# Patient Record
Sex: Male | Born: 1965 | Race: White | Hispanic: No | Marital: Married | Smoking: Never smoker
Health system: Southern US, Community
[De-identification: ages and names within clinical notes are randomized; demographics above are authoritative.]

## PROBLEM LIST (undated history)

## (undated) DIAGNOSIS — K7689 Other specified diseases of liver: Secondary | ICD-10-CM

## (undated) DIAGNOSIS — M79671 Pain in right foot: Secondary | ICD-10-CM

## (undated) DIAGNOSIS — G8929 Other chronic pain: Secondary | ICD-10-CM

## (undated) DIAGNOSIS — Q6102 Congenital multiple renal cysts: Secondary | ICD-10-CM

## (undated) DIAGNOSIS — Z125 Encounter for screening for malignant neoplasm of prostate: Secondary | ICD-10-CM

## (undated) DIAGNOSIS — N529 Male erectile dysfunction, unspecified: Secondary | ICD-10-CM

## (undated) DIAGNOSIS — M159 Polyosteoarthritis, unspecified: Secondary | ICD-10-CM

## (undated) DIAGNOSIS — K219 Gastro-esophageal reflux disease without esophagitis: Secondary | ICD-10-CM

## (undated) DIAGNOSIS — R0789 Other chest pain: Principal | ICD-10-CM

## (undated) DIAGNOSIS — N182 Chronic kidney disease, stage 2 (mild): Secondary | ICD-10-CM

## (undated) DIAGNOSIS — M545 Low back pain, unspecified: Secondary | ICD-10-CM

## (undated) DIAGNOSIS — G4733 Obstructive sleep apnea (adult) (pediatric): Secondary | ICD-10-CM

## (undated) DIAGNOSIS — E7800 Pure hypercholesterolemia, unspecified: Secondary | ICD-10-CM

## (undated) DIAGNOSIS — E669 Obesity, unspecified: Secondary | ICD-10-CM

## (undated) DIAGNOSIS — K5900 Constipation, unspecified: Secondary | ICD-10-CM

## (undated) DIAGNOSIS — K8020 Calculus of gallbladder without cholecystitis without obstruction: Secondary | ICD-10-CM

## (undated) DIAGNOSIS — M170 Bilateral primary osteoarthritis of knee: Secondary | ICD-10-CM

## (undated) DIAGNOSIS — R011 Cardiac murmur, unspecified: Secondary | ICD-10-CM

## (undated) DIAGNOSIS — E119 Type 2 diabetes mellitus without complications: Principal | ICD-10-CM

## (undated) DIAGNOSIS — E66811 Obesity, class 1: Secondary | ICD-10-CM

## (undated) DIAGNOSIS — M79672 Pain in left foot: Secondary | ICD-10-CM

## (undated) DIAGNOSIS — N200 Calculus of kidney: Secondary | ICD-10-CM

## (undated) HISTORY — DX: Bilateral primary osteoarthritis of knee: M17.0

## (undated) HISTORY — DX: Other chronic pain: G89.29

## (undated) HISTORY — DX: Obesity, unspecified: E66.9

## (undated) HISTORY — DX: Cardiac murmur, unspecified: R01.1

## (undated) HISTORY — DX: Type 2 diabetes mellitus without complications: E11.9

## (undated) HISTORY — DX: Chronic kidney disease, stage 2 (mild): N18.2

## (undated) HISTORY — DX: Polyosteoarthritis, unspecified: M15.9

## (undated) HISTORY — DX: Calculus of gallbladder without cholecystitis without obstruction: K80.20

## (undated) HISTORY — DX: Pure hypercholesterolemia, unspecified: E78.00

## (undated) HISTORY — DX: Constipation, unspecified: K59.00

## (undated) HISTORY — DX: Obesity, class 1: E66.811

## (undated) HISTORY — PX: US RENAL/AORTA: HXRAD530

## (undated) HISTORY — DX: Encounter for screening for malignant neoplasm of prostate: Z12.5

## (undated) HISTORY — DX: Pain in left foot: M79.672

## (undated) HISTORY — DX: Congenital multiple renal cysts: Q61.02

## (undated) HISTORY — DX: Gastro-esophageal reflux disease without esophagitis: K21.9

## (undated) HISTORY — DX: Low back pain, unspecified: M54.50

## (undated) HISTORY — DX: Obstructive sleep apnea (adult) (pediatric): G47.33

## (undated) HISTORY — DX: Other chest pain: R07.89

## (undated) HISTORY — DX: Male erectile dysfunction, unspecified: N52.9

## (undated) HISTORY — DX: Pain in right foot: M79.671

## (undated) HISTORY — DX: Other specified diseases of liver: K76.89

---

## 1989-11-16 HISTORY — PX: ANTERIOR CRUCIATE LIGAMENT REPAIR: SHX115

## 2003-01-25 ENCOUNTER — Encounter: Payer: Self-pay | Admitting: Emergency Medicine

## 2003-01-25 ENCOUNTER — Emergency Department (HOSPITAL_COMMUNITY): Admission: EM | Admit: 2003-01-25 | Discharge: 2003-01-25 | Payer: Self-pay | Admitting: Emergency Medicine

## 2009-03-31 ENCOUNTER — Ambulatory Visit: Payer: Self-pay | Admitting: Family Medicine

## 2009-03-31 DIAGNOSIS — S335XXA Sprain of ligaments of lumbar spine, initial encounter: Secondary | ICD-10-CM

## 2009-03-31 DIAGNOSIS — S339XXA Sprain of unspecified parts of lumbar spine and pelvis, initial encounter: Secondary | ICD-10-CM | POA: Insufficient documentation

## 2010-10-31 ENCOUNTER — Ambulatory Visit: Payer: Self-pay | Admitting: Emergency Medicine

## 2010-10-31 LAB — CONVERTED CEMR LAB: Rapid Strep: NEGATIVE

## 2010-12-18 NOTE — Assessment & Plan Note (Signed)
Summary: COLD/PAIN IN BACK FROM KIDNEY STONE?   Vital Signs:  Patient Profile:   45 Years Old Male CC:      Cold & URI symptoms Height:     78 inches Weight:      279 pounds O2 Sat:      98 % O2 treatment:    Room Air Temp:     98.5 degrees F oral Pulse rate:   95 / minute Resp:     14 per minute BP sitting:   123 / 79  (left arm) Cuff size:   large  Vitals Entered By: Lajean Saver RN (October 31, 2010 8:45 AM)                  Updated Prior Medication List: * ALEVE as needed POTASSIUM CHLORIDE 10 MEQ/100ML SOLN (POTASSIUM CHLORIDE) 2 pc tid  Current Allergies: No known allergies History of Present Illness History from: patient Chief Complaint: Cold & URI symptoms History of Present Illness: Patient complains of onset of cold symptoms for 2 days.  They have been using Aleve which is helping a little bit. + sore throat + prod cough No pleuritic pain No wheezing + nasal congestion + post-nasal drainage No sinus pain/pressure No chest congestion No itchy/red eyes No earache No hemoptysis No SOB + chills/sweats No fever No nausea No vomiting No abdominal pain No diarrhea No skin rashes + fatigue + myalgias + headache   REVIEW OF SYSTEMS Constitutional Symptoms       Complains of night sweats.     Denies fever, chills, weight loss, weight gain, and fatigue.  Eyes       Denies change in vision, eye pain, eye discharge, glasses, contact lenses, and eye surgery. Ear/Nose/Throat/Mouth       Complains of frequent runny nose, sinus problems, sore throat, and hoarseness.      Denies hearing loss/aids, change in hearing, ear pain, ear discharge, dizziness, frequent nose bleeds, and tooth pain or bleeding.  Respiratory       Complains of productive cough.      Denies dry cough, wheezing, shortness of breath, asthma, bronchitis, and emphysema/COPD.  Cardiovascular       Denies murmurs, chest pain, and tires easily with exhertion.    Gastrointestinal  Denies stomach pain, nausea/vomiting, diarrhea, constipation, blood in bowel movements, and indigestion. Genitourniary       Denies painful urination, kidney stones, and loss of urinary control. Neurological       Complains of headaches.      Denies paralysis, seizures, and fainting/blackouts. Musculoskeletal       Denies muscle pain, joint pain, joint stiffness, decreased range of motion, redness, swelling, muscle weakness, and gout.  Skin       Denies bruising, unusual mles/lumps or sores, and hair/skin or nail changes.  Psych       Denies mood changes, temper/anger issues, anxiety/stress, speech problems, depression, and sleep problems. Other Comments: patient c/o symptoms x 2 days   Past History:  Past Medical History: Unremarkable frequent kidney stones  Past Surgical History: Reviewed history from 03/31/2009 and no changes required. knee surgery  Family History: Reviewed history from 03/31/2009 and no changes required. mother, father and 3 brothers alive and healthy  Social History: Reviewed history from 03/31/2009 and no changes required. denies drinking denies smoking deines recreational drug use Physical Exam General appearance: well developed, well nourished, no acute distress Ears: normal, no lesions or deformities Nasal: clear discharge Oral/Pharynx: tongue normal, posterior pharynx without  erythema or exudate Neck: neck supple,  trachea midline, no masses Chest/Lungs: no rales, wheezes, or rhonchi bilateral, breath sounds equal without effort Heart: regular rate and  rhythm, no murmur Skin: no obvious rashes or lesions MSE: oriented to time, place, and person Assessment New Problems: UPPER RESPIRATORY INFECTION, ACUTE (ICD-465.9)  most likely viral URI rapid strep neg  Plan New Medications/Changes: AMOXICILLIN 875 MG TABS (AMOXICILLIN) 1 by mouth two times a day for 7 days  #14 x 0, 10/31/2010, Hoyt Koch MD PREDNISONE (PAK) 10 MG TABS  (PREDNISONE) use as directed  #QS x 0, 10/31/2010, Hoyt Koch MD CHERATUSSIN AC 100-10 MG/5ML SYRP (GUAIFENESIN-CODEINE) 5cc q6 hrs as needed cough  #6 oz x 0, 10/31/2010, Hoyt Koch MD  New Orders: Est. Patient Level III [04540] Rapid Strep [98119] Planning Comments:   1)  Take the prescribed antibiotic as instructed (hold for a few days) 2)  Use nasal saline solution (over the counter) at least 3 times a day. 3)  Use over the counter decongestants like Zyrtec-D every 12 hours as needed to help with congestion. 4)  Can take tylenol every 6 hours or motrin every 8 hours for pain or fever. 5)  Follow up with your primary doctor  if no improvement in 5-7 days, sooner if increasing pain, fever, or new symptoms.     The patient and/or caregiver has been counseled thoroughly with regard to medications prescribed including dosage, schedule, interactions, rationale for use, and possible side effects and they verbalize understanding.  Diagnoses and expected course of recovery discussed and will return if not improved as expected or if the condition worsens. Patient and/or caregiver verbalized understanding.  Prescriptions: AMOXICILLIN 875 MG TABS (AMOXICILLIN) 1 by mouth two times a day for 7 days  #14 x 0   Entered and Authorized by:   Hoyt Koch MD   Signed by:   Hoyt Koch MD on 10/31/2010   Method used:   Print then Give to Patient   RxID:   [id number] PREDNISONE (PAK) 10 MG TABS (PREDNISONE) use as directed  #QS x 0   Entered and Authorized by:   Hoyt Koch MD   Signed by:   Hoyt Koch MD on 10/31/2010   Method used:   Print then Give to Patient   RxID:   [id number] CHERATUSSIN AC 100-10 MG/5ML SYRP (GUAIFENESIN-CODEINE) 5cc q6 hrs as needed cough  #6 oz x 0   Entered and Authorized by:   Hoyt Koch MD   Signed by:   Hoyt Koch MD on 10/31/2010   Method used:   Print then Give to Patient   RxID:    [id number]   Orders Added: 1)  Est. Patient Level III [42595] 2)  Rapid Strep [63875]    Laboratory Results  Date/Time Received: October 31, 2010 9:03 AM  Date/Time Reported: October 31, 2010 9:03 AM   Other Tests  Rapid Strep: negative  Kit Test Internal QC: Negative   (Normal Range: Negative)

## 2010-12-18 NOTE — Letter (Signed)
Summary: RX POLICY   RX POLICY   Imported By: Dannette Barbara 10/31/2010 09:09:01  _____________________________________________________________________  External Attachment:    Type:   Image     Comment:   External Document

## 2012-08-04 ENCOUNTER — Emergency Department
Admission: EM | Admit: 2012-08-04 | Discharge: 2012-08-04 | Disposition: A | Discharge status: Home / Self Care | Payer: Managed Care, Other (non HMO) | Source: Home / Self Care | Attending: Family Medicine | Admitting: Family Medicine

## 2012-08-04 ENCOUNTER — Encounter: Payer: Self-pay | Admitting: Emergency Medicine

## 2012-08-04 DIAGNOSIS — H60399 Other infective otitis externa, unspecified ear: Secondary | ICD-10-CM

## 2012-08-04 DIAGNOSIS — H6092 Unspecified otitis externa, left ear: Secondary | ICD-10-CM

## 2012-08-04 DIAGNOSIS — H6690 Otitis media, unspecified, unspecified ear: Secondary | ICD-10-CM

## 2012-08-04 DIAGNOSIS — H6692 Otitis media, unspecified, left ear: Secondary | ICD-10-CM

## 2012-08-04 HISTORY — DX: Calculus of kidney: N20.0

## 2012-08-04 MED ORDER — CIPROFLOXACIN-DEXAMETHASONE 0.3-0.1 % OT SUSP: 4.0000 [drp] | Freq: Two times a day (BID) | OTIC | Status: DC

## 2012-08-04 MED ORDER — AMOXICILLIN-POT CLAVULANATE 875-125 MG PO TABS: 1.0000 | ORAL_TABLET | Freq: Two times a day (BID) | ORAL | Status: DC

## 2012-08-04 NOTE — ED Provider Notes (Signed)
History     CSN: [id number]  Arrival date & time 08/04/12  1625   First MD Initiated Contact with Patient 08/04/12 1707      Chief Complaint  Patient presents with  . Otalgia     Patient is a 46 y.o. male presenting with ear pain. The history is provided by the patient.  Otalgia This is a new problem. Episode onset: 5 days ago. There is pain in the left ear. The problem occurs constantly. The problem has been gradually worsening. There has been no fever. The pain is mild. Associated symptoms include ear discharge and hearing loss. Pertinent negatives include no headaches, no rhinorrhea, no sore throat, no vomiting, no neck pain, no cough and no rash.    Past Medical History  Diagnosis Date  . Kidney stones     Past Surgical History  Procedure Date  . Anterior cruciate ligament repair     Family History  Problem Relation Age of Onset  . Osteoarthritis Mother   . Heart failure Father     History  Substance Use Topics  . Smoking status: Never Smoker   . Smokeless tobacco: Not on file  . Alcohol Use: No      Review of Systems  HENT: Positive for hearing loss, ear pain and ear discharge. Negative for sore throat, rhinorrhea and neck pain.   Respiratory: Negative for cough.   Gastrointestinal: Negative for vomiting.  Skin: Negative for rash.  Neurological: Negative for headaches.  All other systems reviewed and are negative.   No sore throat No cough No pleuritic pain No wheezing No nasal congestion No post-nasal drainage No sinus pain/pressure No itchy/red eyes + left earache No hemoptysis No SOB No fever/chills No nausea No vomiting No abdominal pain No diarrhea No urinary symptoms No skin rashes No fatigue No myalgias No headache   Allergies  Review of patient's allergies indicates no known allergies.  Home Medications   Current Outpatient Rx  Name Route Sig Dispense Refill  . POTASSIUM CHLORIDE CRYS ER 15 MEQ PO TBCR Oral Take 15 mEq by  mouth 2 (two) times daily.    . AMOXICILLIN-POT CLAVULANATE 875-125 MG PO TABS Oral Take 1 tablet by mouth 2 (two) times daily. Take with food 20 tablet 0  . CIPROFLOXACIN-DEXAMETHASONE 0.3-0.1 % OT SUSP Left Ear Place 4 drops into the left ear 2 (two) times daily. 7.5 mL 0    BP 131/83  Pulse 84  Temp 98.3 F (36.8 C) (Oral)  Resp 16  Ht 6\' 6"  (1.981 m)  Wt 268 lb (121.564 kg)  BMI 30.97 kg/m2  SpO2 96%  Physical Exam Nursing notes and Vital Signs reviewed. Appearance:  Patient appears healthy, stated age, and in no acute distress Eyes:  Pupils are equal, round, and reactive to light and accomodation.  Extraocular movement is intact.  Conjunctivae are not inflamed  Ears:   Right canal and tympanic membrane normal.  Left canal is edematous with whitish debris and exudate present.  Unable to visualize the left TM because of narrowness of canal.  There is tenderness with insertion of speculum.  Nose:  Mildly congested turbinates.  No sinus tenderness.   Pharynx:  Normal Neck:  Supple.  No adenopathy Lungs:  Clear to auscultation.  Breath sounds are equal.  Heart:  Regular rate and rhythm without murmurs, rubs, or gallops.  Skin:  No rash present.   ED Course  Procedures  None  Labs Reviewed -  Tympanogram low peak height left  ear ("flat line"), normal right ear    1. Left otitis externa   2. Left otitis media       MDM  Begin Ciprodex drops, and Augmentin oral. Suggest using Mucinex D Followup with Family Doctor in 10 days.        Lattie Haw, MD 08/05/12 1348

## 2012-08-04 NOTE — ED Notes (Signed)
Left ear pain x 5 days, rt ear is starting to hurt

## 2012-08-05 ENCOUNTER — Telehealth: Payer: Self-pay | Admitting: Family Medicine

## 2012-08-16 ENCOUNTER — Encounter (HOSPITAL_BASED_OUTPATIENT_CLINIC_OR_DEPARTMENT_OTHER): Payer: Self-pay | Admitting: *Deleted

## 2012-08-16 ENCOUNTER — Emergency Department (HOSPITAL_BASED_OUTPATIENT_CLINIC_OR_DEPARTMENT_OTHER)
Admission: EM | Admit: 2012-08-16 | Discharge: 2012-08-16 | Disposition: A | Discharge status: Home / Self Care | Payer: Managed Care, Other (non HMO) | Attending: Emergency Medicine | Admitting: Emergency Medicine

## 2012-08-16 DIAGNOSIS — T380X5A Adverse effect of glucocorticoids and synthetic analogues, initial encounter: Secondary | ICD-10-CM | POA: Insufficient documentation

## 2012-08-16 DIAGNOSIS — T7840XA Allergy, unspecified, initial encounter: Secondary | ICD-10-CM | POA: Insufficient documentation

## 2012-08-16 DIAGNOSIS — T50905A Adverse effect of unspecified drugs, medicaments and biological substances, initial encounter: Secondary | ICD-10-CM

## 2012-08-16 DIAGNOSIS — L509 Urticaria, unspecified: Secondary | ICD-10-CM

## 2012-08-16 MED ORDER — DIPHENHYDRAMINE HCL 50 MG/ML IJ SOLN
25.0000 mg | Freq: Once | INTRAMUSCULAR | Status: AC
  Administered 2012-08-16: 25 mg via INTRAVENOUS
  Filled 2012-08-16: qty 1

## 2012-08-16 MED ORDER — FAMOTIDINE IN NACL 20-0.9 MG/50ML-% IV SOLN
20.0000 mg | Freq: Once | INTRAVENOUS | Status: AC
  Administered 2012-08-16: 20 mg via INTRAVENOUS
  Filled 2012-08-16: qty 50

## 2012-08-16 MED ORDER — METHYLPREDNISOLONE SODIUM SUCC 125 MG IJ SOLR
125.0000 mg | Freq: Once | INTRAMUSCULAR | Status: AC
  Administered 2012-08-16: 125 mg via INTRAVENOUS
  Filled 2012-08-16: qty 2

## 2012-08-16 MED ORDER — SODIUM CHLORIDE 0.9 % IV SOLN
Freq: Once | INTRAVENOUS | Status: AC
  Administered 2012-08-16: 15:00:00 via INTRAVENOUS

## 2012-08-16 MED ORDER — ALBUTEROL SULFATE (5 MG/ML) 0.5% IN NEBU
5.0000 mg | INHALATION_SOLUTION | Freq: Once | RESPIRATORY_TRACT | Status: AC
  Administered 2012-08-16: 5 mg via RESPIRATORY_TRACT
  Filled 2012-08-16: qty 1

## 2012-08-16 MED ORDER — PREDNISONE 10 MG PO TABS: ORAL_TABLET | ORAL | Status: DC

## 2012-08-16 NOTE — ED Notes (Signed)
Pt placed on 2lpm for comfort.  Pt stating he is having trouble breathing.  No distress noted at this time.  RA Spo2 95%.  ZO10, BBS clear no stridor noted.

## 2012-08-16 NOTE — ED Notes (Addendum)
Allergic reaction. Lips are swelling. Diff breathing and hives. He is taking Prednisone and Benadryl for same on and off since Friday. Amoxicillin and Cipro are listed in his med list. He states he was being treated for ear infection with those medications and finished the last dose on Sat.

## 2012-08-16 NOTE — ED Provider Notes (Signed)
History     CSN: [id number]  Arrival date & time 08/16/12  1428   First MD Initiated Contact with Patient 08/16/12 1445      Chief Complaint  Patient presents with  . Allergic Reaction    (Consider location/radiation/quality/duration/timing/severity/associated sxs/prior treatment) Patient is a 46 y.o. male presenting with allergic reaction. The history is provided by the patient. No language interpreter was used.  Allergic Reaction The primary symptoms are  shortness of breath and urticaria. The current episode started 2 days ago. The problem has been gradually worsening. This is a new problem.  The shortness of breath began today. The shortness of breath is mild.  The urticaria began more than 2 days ago. The urticaria has been gradually improving since its onset. Urticaria is a recurrent problem. Urticaria is located on the abdomen and chest.  The onset of the reaction was associated with a new medication. Significant symptoms also include flushing and itching.  Pt was treated for an ear infection with augmentin and cipro ear drops.   Pt developed a rash and swelling to lips on Saturday.  [place] and is on 3rd and last day of prednisone.   Pt reports increased lip swelling and hives.    Past Medical History  Diagnosis Date  . Kidney stones     Past Surgical History  Procedure Date  . Anterior cruciate ligament repair     Family History  Problem Relation Age of Onset  . Osteoarthritis Mother   . Heart failure Father     History  Substance Use Topics  . Smoking status: Never Smoker   . Smokeless tobacco: Not on file  . Alcohol Use: No      Review of Systems  Respiratory: Positive for shortness of breath.   Skin: Positive for flushing and itching.  All other systems reviewed and are negative.    Allergies  Review of patient's allergies indicates no known allergies.  Home Medications   Current Outpatient Rx  Name Route Sig Dispense Refill    . AMOXICILLIN-POT CLAVULANATE 875-125 MG PO TABS Oral Take 1 tablet by mouth 2 (two) times daily. Take with food 20 tablet 0  . CIPROFLOXACIN-DEXAMETHASONE 0.3-0.1 % OT SUSP Left Ear Place 4 drops into the left ear 2 (two) times daily. 7.5 mL 0  . POTASSIUM CHLORIDE CRYS ER 15 MEQ PO TBCR Oral Take 15 mEq by mouth 2 (two) times daily.      BP 147/87  Pulse 106  Temp 98.6 F (37 C) (Oral)  Resp 20  SpO2 96%  Physical Exam  Nursing note and vitals reviewed. Constitutional: He is oriented to person, place, and time. He appears well-developed and well-nourished.  HENT:  Head: Normocephalic and atraumatic.  Right Ear: External ear normal.  Left Ear: External ear normal.  Nose: Nose normal.  Mouth/Throat: Oropharynx is clear and moist.  Eyes: Conjunctivae normal and EOM are normal. Pupils are equal, round, and reactive to light.  Neck: Normal range of motion. Neck supple.  Cardiovascular: Normal rate and regular rhythm.   Pulmonary/Chest: Effort normal and breath sounds normal.  Abdominal: Soft.  Musculoskeletal: Normal range of motion.  Neurological: He is alert and oriented to person, place, and time. He has normal reflexes.  Skin: Rash noted.  Psychiatric: He has a normal mood and affect.    ED Course  Procedures (including critical care time)  Labs Reviewed - No data to display No results found.   No diagnosis  found.    MDM  Pt given solumedrol, pepcid and benadryl  Iv.   I gave pt albuterol neb.   Pt reports he does not have any trouble breathing.   Pt reports paniced with lip swelling  Pt observed x 3 hours.   I advised pepdid 20 mg a day and benadryl 50 mg every 4 hours.   Prednisone x 6 days.      Lonia Skinner [place], PA 08/16/12 1744  Lonia Skinner [place], [place] 08/16/12 [phone]

## 2012-08-17 NOTE — ED Provider Notes (Signed)
  I performed a history and physical examination of Adrian Cook and discussed his management withKaren Keenan Bachelor, PA-CI agree with the history, physical, assessment, and plan of care, with the following exceptions: None  I was present for the following procedures: None Time Spent in Critical Care of the patient: None Time spent in discussions with the patient and family:7 Cambryn Charters Corlis Leak, MD 08/17/12 1507

## 2012-12-31 ENCOUNTER — Emergency Department (INDEPENDENT_AMBULATORY_CARE_PROVIDER_SITE_OTHER)
Admission: EM | Admit: 2012-12-31 | Discharge: 2012-12-31 | Disposition: A | Discharge status: Home / Self Care | Payer: Managed Care, Other (non HMO) | Source: Home / Self Care | Attending: Family Medicine | Admitting: Family Medicine

## 2012-12-31 DIAGNOSIS — J069 Acute upper respiratory infection, unspecified: Secondary | ICD-10-CM

## 2012-12-31 MED ORDER — AZITHROMYCIN 250 MG PO TABS: ORAL_TABLET | ORAL | Status: DC

## 2012-12-31 MED ORDER — BENZONATATE 200 MG PO CAPS: 200.0000 mg | ORAL_CAPSULE | Freq: Every day | ORAL | Status: DC

## 2012-12-31 NOTE — ED Provider Notes (Signed)
History     CSN: [id number]  Arrival date & time 12/31/12  1103   First MD Initiated Contact with Patient 12/31/12 1242      Chief Complaint  Patient presents with  . Generalized Body Aches    x 4 days  . Eye Pain    x 4 day  . Otalgia    x 4 day  . Cough    x 4 days       HPI Comments: Patient complains of approximately 4 day history of gradually progressive URI symptoms beginning with a mild sore throat (now improved), followed by progressive nasal congestion.  A cough started next.  Complains of fatigue and initial myalgias.  Cough is now worse at night and generally productive during the day.  There has been no pleuritic pain or wheezes, but he notes some tightness in his anterior chest.  He has had mild shortness of breath with activity.   The history is provided by the patient.    Past Medical History  Diagnosis Date  . Kidney stones     Past Surgical History  Procedure Laterality Date  . Anterior cruciate ligament repair      Family History  Problem Relation Age of Onset  . Osteoarthritis Mother   . Heart failure Father     History  Substance Use Topics  . Smoking status: Never Smoker   . Smokeless tobacco: Not on file  . Alcohol Use: No      Review of Systems + sore throat + cough No pleuritic pain but has mild anterior chest discomfort No wheezing + nasal congestion + post-nasal drainage No sinus pain/pressure No itchy/red eyes ? earache No hemoptysis + SOB No fever/chills No nausea No vomiting No abdominal pain No diarrhea No urinary symptoms No skin rashes + fatigue + myalgias + headache Used OTC meds without relief  Allergies  Review of patient's allergies indicates no known allergies.  Home Medications   Current Outpatient Rx  Name  Route  Sig  Dispense  Refill  . potassium chloride SA (KLOR-CON M15) 15 MEQ tablet   Oral   Take 15 mEq by mouth 2 (two) times daily.         Marland Kitchen azithromycin (ZITHROMAX Z-PAK) 250 MG  tablet      Take 2 tabs today; then begin one tab once daily for 4 more days. (Rx void after 01/08/13)   6 each   0   . benzonatate (TESSALON) 200 MG capsule   Oral   Take 1 capsule (200 mg total) by mouth at bedtime. Take as needed for cough   12 capsule   0     BP 128/83  Pulse 75  Temp(Src) 97.8 F (36.6 C) (Oral)  Ht 6\' 5"  (1.956 m)  Wt 275 lb (124.739 kg)  BMI 32.6 kg/m2  SpO2 96%  Physical Exam Nursing notes and Vital Signs reviewed. Appearance:  Patient appears healthy, stated age, and in no acute distress Eyes:  Pupils are equal, round, and reactive to light and accomodation.  Extraocular movement is intact.  Conjunctivae are not inflamed  Ears:  Canals normal.  Tympanic membranes normal.  Nose:  Mildly congested turbinates.  No sinus tenderness.    Pharynx:  Normal Neck:  Supple.  Slightly tender shotty posterior nodes are palpated bilaterally  Lungs:  Clear to auscultation.  Breath sounds are equal.  Heart:  Regular rate and rhythm without murmurs, rubs, or gallops.  Abdomen:  Nontender without masses or  hepatosplenomegaly.  Bowel sounds are present.  No CVA or flank tenderness.  Extremities:  No edema.  No calf tenderness Skin:  No rash present.   ED Course  Procedures  none      1. Acute upper respiratory infections of unspecified site; suspect early viral URI       MDM  There is no evidence of bacterial infection today.   Treat symptomatically for now  Prescription written for Benzonatate (Tessalon) to take at bedtime for night-time cough.  Take Mucinex D (guaifenesin with decongestant) twice daily for congestion.  Increase fluid intake, rest. May use Afrin nasal spray (or generic oxymetazoline) twice daily for about 5 days.  Also recommend using saline nasal spray several times daily and saline nasal irrigation (AYR is a common brand) Stop all antihistamines for now, and other non-prescription cough/cold preparations. Begin Azithromycin if not  improving about 5 days or if persistent fever develops (Given a prescription to hold, with an expiration date)  Recommend a Tdap when well.  Follow-up with family doctor if not improving about 10 days.         Lattie Haw, MD 01/01/13 1336

## 2012-12-31 NOTE — ED Notes (Signed)
Adrian Cook complains of body aches, bilateral eye pain, right ear pain, headaches, sore throat, nasal congestion, sneezing and productive cough with green sputum for 4 days. Denies fever, chills or sweats. His nasal drainage is thick and green. He has tried OTC sinus medication with little relief.

## 2013-12-30 ENCOUNTER — Encounter: Payer: Self-pay | Admitting: Emergency Medicine

## 2013-12-30 ENCOUNTER — Emergency Department (INDEPENDENT_AMBULATORY_CARE_PROVIDER_SITE_OTHER)
Admission: EM | Admit: 2013-12-30 | Discharge: 2013-12-30 | Disposition: A | Discharge status: Home / Self Care | Payer: BC Managed Care – PPO | Source: Home / Self Care | Attending: Family Medicine | Admitting: Family Medicine

## 2013-12-30 DIAGNOSIS — H6120 Impacted cerumen, unspecified ear: Secondary | ICD-10-CM

## 2013-12-30 MED ORDER — CARBAMIDE PEROXIDE 6.5 % OT SOLN: OTIC | Status: DC

## 2013-12-30 NOTE — Discharge Instructions (Signed)
Cerumen Impaction A cerumen impaction is when the wax in your ear forms a plug. This plug usually causes reduced hearing. Sometimes it also causes an earache or dizziness. Removing a cerumen impaction can be difficult and painful. The wax sticks to the ear canal. The canal is sensitive and bleeds easily. If you try to remove a heavy wax buildup with a cotton tipped swab, you may push it in further. Irrigation with water, suction, and small ear curettes may be used to clear out the wax. If the impaction is fixed to the skin in the ear canal, ear drops may be needed for a few days to loosen the wax. People who build up a lot of wax frequently can use ear wax removal products available in your local drugstore. SEEK MEDICAL CARE IF:  You develop an earache, increased hearing loss, or marked dizziness. Document Released: 12/10/2004 Document Revised: 01/25/2012 Document Reviewed: 01/30/2010 [employer] Patient Information 2014 [employer].  

## 2013-12-30 NOTE — ED Notes (Signed)
Reports sense of fullness in left ear x 1 week.

## 2013-12-30 NOTE — ED Provider Notes (Signed)
CSN: [id number]     Arrival date & time 12/30/13  0908 History   First MD Initiated Contact with Patient 12/30/13 [phone]     Chief Complaint  Patient presents with  . Ear Fullness        HPI Comments: Patient complains of left ear being clogged, without pain, for one week.  The history is provided by the patient.    Past Medical History  Diagnosis Date  . Kidney stones    Past Surgical History  Procedure Laterality Date  . Anterior cruciate ligament repair     Family History  Problem Relation Age of Onset  . Osteoarthritis Mother   . Heart failure Father    History  Substance Use Topics  . Smoking status: Never Smoker   . Smokeless tobacco: Not on file  . Alcohol Use: No    Review of Systems No sore throat No cough No pleuritic pain No wheezing No nasal congestion No post-nasal drainage No sinus pain/pressure No itchy/red eyes ? Earache; left ear feels clogged No hemoptysis No SOB No fever/chills No nausea No vomiting No abdominal pain No diarrhea No urinary symptoms No skin rash No fatigue No myalgias No headache     Allergies  Review of patient's allergies indicates no known allergies.  Home Medications   Current Outpatient Rx  Name  Route  Sig  Dispense  Refill  . potassium chloride SA (KLOR-CON M15) 15 MEQ tablet   Oral   Take 15 mEq by mouth 2 (two) times daily.          BP 131/78  Pulse 72  Temp(Src) 98 F (36.7 C) (Oral)  Resp 16  Ht 6\' 6"  (1.981 m)  Wt 265 lb (120.203 kg)  BMI 30.63 kg/m2  SpO2 96% Physical Exam Nursing notes and Vital Signs reviewed. Appearance:  Patient appears healthy, stated age, and in no acute distress Eyes:  Pupils are equal, round, and reactive to light and accomodation.  Extraocular movement is intact.  Conjunctivae are not inflamed  Ears:  Right canal and tympanic membrane normal.  Left canal occluded with ceruem Nose:  Normal turbinates.  No sinus tenderness.  Pharynx:  Normal Neck:  Supple.  No  adenopathy     ED Course  Procedures Left ear lavage:  Post lavage, canal remains partly occluded but tympanic membrane appears normal.  Patient reports that his hearing is restored in left ear.           MDM   Final diagnoses:  Cerumen impaction    Rx written for Debrox.  Return prn    Lattie HawStephen A Sher Hellinger, MD 12/31/13 [phone]

## 2014-11-16 DIAGNOSIS — R0789 Other chest pain: Secondary | ICD-10-CM

## 2014-11-16 HISTORY — DX: Other chest pain: R07.89

## 2015-01-23 ENCOUNTER — Ambulatory Visit (INDEPENDENT_AMBULATORY_CARE_PROVIDER_SITE_OTHER): Payer: BLUE CROSS/BLUE SHIELD | Admitting: Family Medicine

## 2015-01-23 ENCOUNTER — Encounter: Payer: Self-pay | Admitting: Family Medicine

## 2015-01-23 VITALS — BP 120/82 | HR 79 | Temp 97.8°F | Ht 76.5 in | Wt 275.0 lb

## 2015-01-23 DIAGNOSIS — R0789 Other chest pain: Secondary | ICD-10-CM

## 2015-01-23 DIAGNOSIS — G4733 Obstructive sleep apnea (adult) (pediatric): Secondary | ICD-10-CM

## 2015-01-23 NOTE — Progress Notes (Signed)
Office Note 01/23/2015  CC:  Chief Complaint  Patient presents with  . Establish Care   HPI:  Adrian Cook is a 49 y.o. White male who is here to establish care. Patient's most recent primary MD: Dr. Foy Guadalajara but has not seen him in at least 2 yrs.  Specialist: Dr. Margarita Grizzle at [facility] urology. Old records were not reviewed prior to or during today's visit.  Says he gets a soreness/pain around left side of chest only occasionally, was worked up about 10 yrs ago in the ED and was told it was anxiety. He does get this pain when he exerts himself but more often it occurs w/out exertion.  Goes down left arm.  No nausea, gets a light sweat, no palpitations or lightheaded feeling.   This chest pain occurs approx 2 times a month, usually lasts 4-5 hours.  It has been this way over the last 10 yrs.  Hx of GERD but this has improved with dietary changes. Takes rolaids prn.  Has OSA, not using CPAP b/c of "laziness".  His employer is working on getting him a sleep study and apparently all subsequent testing/supplies would be taken care of (so pulm/sleep med referral not required).   Past Medical History  Diagnosis Date  . Kidney stones   . Arthritis   . GERD (gastroesophageal reflux disease)   . Heart murmur     Found at age 16, w/u unrevealing.  No cardiac symptoms.  . Constipation   . OSA (obstructive sleep apnea) approx 2008    was put on CPAP but says he fell out of the habit of using it due to "laziness".  Has CPAP machine but doesn't use it.    Past Surgical History  Procedure Laterality Date  . Anterior cruciate ligament repair  1991    ACL and PCL left knee    Family History  Problem Relation Age of Onset  . Osteoarthritis Mother   . Arthritis Mother   . Heart failure Father   . Leukemia Maternal Grandfather   . Colon cancer Neg Hx   . Prostate cancer Neg Hx     History   Social History  . Marital Status: Married    Spouse Name: N/A  . Number of Children: N/A   . Years of Education: N/A   Occupational History  . Not on file.   Social History Main Topics  . Smoking status: Never Smoker   . Smokeless tobacco: Never Used  . Alcohol Use: No  . Drug Use: No  . Sexual Activity: Not on file   Other Topics Concern  . Not on file   Social History Narrative   Married, no children.   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   No T/A/Ds.    Outpatient Encounter Prescriptions as of 01/23/2015  Medication Sig  . NAPROXEN PO Take 220 mg by mouth as needed. OTC  . Polyethylene Glycol 3350 (MIRALAX PO) Take by mouth as needed. OTC  . potassium citrate (UROCIT-K) 10 MEQ (1080 MG) SR tablet   . [DISCONTINUED] carbamide peroxide (DEBROX) 6.5 % otic solution Place 5 to 10 drops in affected ear BID for 4 days, then flush ear with warm water. (Patient not taking: Reported on 01/23/2015)  . [DISCONTINUED] potassium chloride SA (KLOR-CON M15) 15 MEQ tablet Take 15 mEq by mouth 2 (two) times daily.    No Known Allergies  ROS Review of Systems  Constitutional: Negative for fever and fatigue.  HENT: Negative for congestion and sore throat.   Eyes: Negative for visual disturbance.  Respiratory: Negative for cough.   Cardiovascular: Negative for chest pain.  Gastrointestinal: Negative for nausea and abdominal pain.  Genitourinary: Negative for dysuria.  Musculoskeletal: Negative for back pain and joint swelling.  Skin: Negative for rash.  Neurological: Negative for weakness and headaches.  Hematological: Negative for adenopathy.    PE; Blood pressure 120/82, pulse 79, temperature 97.8 F (36.6 C), temperature source Oral, height 6' 4.5" (1.943 m), weight 275 lb (124.739 kg), SpO2 96 %. BMI 33 Gen: Alert, well appearing.  Patient is oriented to person, place, time, and situation. CV: RRR, no m/r/g.   LUNGS: CTA bilat, nonlabored resps, good aeration in all lung fields. EXT: no clubbing, cyanosis, or edema.   Pertinent  labs:  none  ASSESSMENT AND PLAN:   New pt; obtain old records.  1) Chest pain, atypical.  Long hx, stable and likely not ischemia-related. However, he is 10 yrs older than when he had his last eval for this, plus his BMI is 33. Will be seeing him early next week for fasting CPE and will get bloodwork to complete RF assessment (cholesterol, glucose) and we'll check EKG.  Will then order the appropriate cardiac stress test when we've gotten all of that information.  2) OSA: this is being re-evaluated by his employer (new sleep study).  3) Kidney stones: stable, continue Uricit-K and routine urologic f/u.  An After Visit Summary was printed and given to the patient.  Return for keep appt for 01/28/15 for fasting complete physical exam.

## 2015-01-23 NOTE — Progress Notes (Signed)
Pre visit review using our clinic review tool, if applicable. No additional management support is needed unless otherwise documented below in the visit note. 

## 2015-01-28 ENCOUNTER — Encounter: Payer: Self-pay | Admitting: Family Medicine

## 2015-01-28 ENCOUNTER — Ambulatory Visit (INDEPENDENT_AMBULATORY_CARE_PROVIDER_SITE_OTHER): Payer: BLUE CROSS/BLUE SHIELD | Admitting: Family Medicine

## 2015-01-28 VITALS — BP 131/82 | HR 55 | Temp 97.7°F | Resp 18 | Ht 76.5 in | Wt 277.0 lb

## 2015-01-28 DIAGNOSIS — R0789 Other chest pain: Secondary | ICD-10-CM

## 2015-01-28 DIAGNOSIS — Z23 Encounter for immunization: Secondary | ICD-10-CM

## 2015-01-28 DIAGNOSIS — Z114 Encounter for screening for human immunodeficiency virus [HIV]: Secondary | ICD-10-CM

## 2015-01-28 DIAGNOSIS — H7391 Unspecified disorder of tympanic membrane, right ear: Secondary | ICD-10-CM

## 2015-01-28 DIAGNOSIS — Z0000 Encounter for general adult medical examination without abnormal findings: Secondary | ICD-10-CM

## 2015-01-28 LAB — LIPID PANEL
CHOL/HDL RATIO: 4
Cholesterol: 148 mg/dL (ref 0–200)
HDL: 41.8 mg/dL (ref >39.00)
LDL CALC: 82 mg/dL (ref 0–99)
NonHDL: 106.2
Triglycerides: 122 mg/dL (ref 0.0–149.0)
VLDL: 24.4 mg/dL (ref 0.0–40.0)

## 2015-01-28 LAB — COMPREHENSIVE METABOLIC PANEL
ALK PHOS: 57 U/L (ref 39–117)
ALT: 25 U/L (ref 0–53)
AST: 17 U/L (ref 0–37)
Albumin: 4.5 g/dL (ref 3.5–5.2)
BILIRUBIN TOTAL: 0.9 mg/dL (ref 0.2–1.2)
BUN: 19 mg/dL (ref 6–23)
CO2: 31 meq/L (ref 19–32)
Calcium: 9.3 mg/dL (ref 8.4–10.5)
Chloride: 104 mEq/L (ref 96–112)
Creatinine, Ser: 0.87 mg/dL (ref 0.40–1.50)
GFR: 99.71 mL/min (ref >60.00)
GLUCOSE: 97 mg/dL (ref 70–99)
Potassium: 4.3 mEq/L (ref 3.5–5.1)
SODIUM: 140 meq/L (ref 135–145)
TOTAL PROTEIN: 6.5 g/dL (ref 6.0–8.3)

## 2015-01-28 LAB — CBC WITH DIFFERENTIAL/PLATELET
BASOS ABS: 0 10*3/uL (ref 0.0–0.1)
Basophils Relative: 0.5 % (ref 0.0–3.0)
Eosinophils Absolute: 0.2 10*3/uL (ref 0.0–0.7)
Eosinophils Relative: 3.4 % (ref 0.0–5.0)
HEMATOCRIT: 41.5 % (ref 39.0–52.0)
Hemoglobin: 14.3 g/dL (ref 13.0–17.0)
LYMPHS ABS: 1.3 10*3/uL (ref 0.7–4.0)
Lymphocytes Relative: 26.1 % (ref 12.0–46.0)
MCHC: 34.6 g/dL (ref 30.0–36.0)
MCV: 89.2 fl (ref 78.0–100.0)
MONO ABS: 0.3 10*3/uL (ref 0.1–1.0)
Monocytes Relative: 6 % (ref 3.0–12.0)
NEUTROS PCT: 64 % (ref 43.0–77.0)
Neutro Abs: 3.1 10*3/uL (ref 1.4–7.7)
PLATELETS: 249 10*3/uL (ref 150.0–400.0)
RBC: 4.65 Mil/uL (ref 4.22–5.81)
RDW: 13.2 % (ref 11.5–15.5)
WBC: 4.8 10*3/uL (ref 4.0–10.5)

## 2015-01-28 LAB — TSH: TSH: 2.34 u[IU]/mL (ref 0.35–4.50)

## 2015-01-28 NOTE — Progress Notes (Signed)
Pre visit review using our clinic review tool, if applicable. No additional management support is needed unless otherwise documented below in the visit note. 

## 2015-01-28 NOTE — Patient Instructions (Signed)
Buy OTC generic ear drops called DEBROX at any pharmacy. Use as directed in each ear every day for 2-3 weeks and return for recheck of ears in office.

## 2015-01-28 NOTE — Progress Notes (Signed)
Office Note 01/28/2015  CC:  Chief Complaint  Patient presents with  . Annual Exam    fasting    HPI:  Adrian Cook is a 49 y.o. White male who is here for fasting health maintenance exam. Feeling fine.  Ears: denies hearing impairment, no leakage from either ear, no ear pain or itching.  Wears ear plugs at work. Remote hx of AOM on right but says at that time he had typical AOM sx's.      Past Medical History  Diagnosis Date  . Kidney stones   . Arthritis   . GERD (gastroesophageal reflux disease)   . Heart murmur     Found at age 49, w/u unrevealing.  No cardiac symptoms.  . Constipation   . OSA (obstructive sleep apnea) approx 2008    was put on CPAP but says he fell out of the habit of using it due to "laziness".  Has CPAP machine but doesn't use it.  . Obesity, Class I, BMI 30-34.9   . Atypical chest pain     Past Surgical History  Procedure Laterality Date  . Anterior cruciate ligament repair  1991    ACL and PCL left knee    Family History  Problem Relation Age of Onset  . Osteoarthritis Mother   . Arthritis Mother   . Heart failure Father   . Leukemia Maternal Grandfather   . Colon cancer Neg Hx   . Prostate cancer Neg Hx     History   Social History  . Marital Status: Married    Spouse Name: N/A  . Number of Children: N/A  . Years of Education: N/A   Occupational History  . Not on file.   Social History Main Topics  . Smoking status: Never Smoker   . Smokeless tobacco: Never Used  . Alcohol Use: No  . Drug Use: No  . Sexual Activity: Not on file   Other Topics Concern  . Not on file   Social History Narrative   Married, no children.   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   No T/A/Ds.    Outpatient Prescriptions Prior to Visit  Medication Sig Dispense Refill  . NAPROXEN PO Take 220 mg by mouth as needed. OTC    . Polyethylene Glycol 3350 (MIRALAX PO) Take by mouth as needed. OTC    .  potassium citrate (UROCIT-K) 10 MEQ (1080 MG) SR tablet      No facility-administered medications prior to visit.    Not on File  ROS Review of Systems  Constitutional: Negative for fever, chills, appetite change and fatigue.  HENT: Negative for congestion, dental problem, ear pain and sore throat.   Eyes: Negative for discharge, redness and visual disturbance.  Respiratory: Negative for cough, chest tightness, shortness of breath and wheezing.   Cardiovascular: Negative for chest pain, palpitations and leg swelling.  Gastrointestinal: Negative for nausea, vomiting, abdominal pain, diarrhea and blood in stool.  Genitourinary: Negative for dysuria, urgency, frequency, hematuria, flank pain and difficulty urinating.  Musculoskeletal: Negative for myalgias, back pain, joint swelling, arthralgias and neck stiffness.  Skin: Negative for pallor and rash.  Neurological: Negative for dizziness, speech difficulty, weakness and headaches.  Hematological: Negative for adenopathy. Does not bruise/bleed easily.  Psychiatric/Behavioral: Negative for confusion and sleep disturbance. The patient is not nervous/anxious.     PE; Blood pressure 131/82, pulse 55, temperature 97.7 F (36.5 C), temperature source Temporal, resp. rate 18, height 6'  4.5" (1.943 m), weight 277 lb (125.646 kg), SpO2 99 %. Gen: Alert, well appearing.  Patient is oriented to person, place, time, and situation. AFFECT: pleasant, lucid thought and speech. ENT: Ears: EACs clear, normal epithelium.  TMs with good light reflex and landmarks bilaterally.  Eyes: no injection, icteris, swelling, or exudate.  EOMI, PERRLA. Nose: no drainage or turbinate edema/swelling.  No injection or focal lesion.  Mouth: lips without lesion/swelling.  Oral mucosa pink and moist.  Dentition intact and without obvious caries or gingival swelling.  Oropharynx without erythema, exudate, or swelling.  Neck: supple/nontender.  No LAD, mass, or TM.  Carotid  pulses 2+ bilaterally, without bruits. CV: RRR, no m/r/g.   LUNGS: CTA bilat, nonlabored resps, good aeration in all lung fields. ABD: soft, NT, ND, BS normal.  No hepatospenomegaly or mass.  No bruits. EXT: no clubbing, cyanosis, or edema.  Musculoskeletal: no joint swelling, erythema, warmth, or tenderness.  ROM of all joints intact. Skin - no sores or suspicious lesions or rashes or color changes  12 lead EKG today: sinus brady, rate 55, mild RV cond delay in V1 with inverted T wave in V1.  No ST segment abnormalities, no Q waves, no ectopy.  Normal intervals/durations/voltages.  No prior EKG available for comparison.   ASSESSMENT AND PLAN:   1) HME: Reviewed age and gender appropriate health maintenance issues (prudent diet, regular exercise, health risks of tobacco and excessive alcohol, use of seatbelts, fire alarms in home, use of sunscreen).  Also reviewed age and gender appropriate health screening as well as vaccine recommendations. Tdap today.  Routine HP labs today + HIV screening.  2) Atypical CP (see note from his initial visit with me last week).  EKG today was reassuring. Will await labs and then set up a stress test risk stratification.  3) Abnormal R TM: could be excessive moist cerumen obscuring view of all normal anatomy. Since he is asymptomatic, I recommended he try OTC debrox daily and we'll see if view of anatomy is better in 2-3 weeks.  If unable to reliably see intact/normal TM then we'll have to get him to ENT.  An After Visit Summary was printed and given to the patient.  FOLLOW UP:  Return for 2-3 weeks 15 min f/u o/v to recheck ears.

## 2015-01-29 LAB — HIV ANTIBODY (ROUTINE TESTING W REFLEX): HIV: NONREACTIVE

## 2015-02-13 ENCOUNTER — Ambulatory Visit (INDEPENDENT_AMBULATORY_CARE_PROVIDER_SITE_OTHER): Payer: BLUE CROSS/BLUE SHIELD | Admitting: Family Medicine

## 2015-02-13 ENCOUNTER — Encounter: Payer: Self-pay | Admitting: Family Medicine

## 2015-02-13 VITALS — BP 126/83 | HR 68 | Temp 98.2°F | Ht 76.5 in | Wt 276.0 lb

## 2015-02-13 DIAGNOSIS — H9313 Tinnitus, bilateral: Secondary | ICD-10-CM

## 2015-02-13 DIAGNOSIS — H6120 Impacted cerumen, unspecified ear: Secondary | ICD-10-CM

## 2015-02-13 DIAGNOSIS — R0789 Other chest pain: Secondary | ICD-10-CM

## 2015-02-13 NOTE — Progress Notes (Signed)
OFFICE NOTE  02/13/2015  CC:  Chief Complaint  Patient presents with  . Follow-up    Ears   HPI: Patient is a 49 y.o. Caucasian male who is here for 2 week f/u ears, cerumen problem, has been doing OTC ear drops since last visit and he feels like some wax has come out and he feels like he can hear better.  Does have mild high pitched hum in both ears that he forgot to mention last visit.  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  Pertinent PMH:  Past surgical, social, and family history reviewed and no changes noted since last office visit.  MEDS:  Outpatient Prescriptions Prior to Visit  Medication Sig Dispense Refill  . NAPROXEN PO Take 220 mg by mouth as needed. OTC    . Polyethylene Glycol 3350 (MIRALAX PO) Take by mouth as needed. OTC    . potassium citrate (UROCIT-K) 10 MEQ (1080 MG) SR tablet      No facility-administered medications prior to visit.    PE: Blood pressure 126/83, pulse 68, temperature 98.2 F (36.8 C), temperature source Temporal, height 6' 4.5" (1.943 m), weight 276 lb (125.193 kg), SpO2 95 %. Gen: Alert, well appearing.  Patient is oriented to person, place, time, and situation. EARS: EACs completely clear of cerumen, with mildly erythematous and flaky epithelial lining of EACs diffusely. EACs are tortuous and I cannot get a speculum in far enough to visualize the medial 2/3 of the TM on either side. Visualized portion of L TM is pearly colored and looks retracted. Visualized portion of R TM has normal translucency and appears retracted.  IMPRESSION AND PLAN:  1) Cerumenosis; improved/resolved s/p OTC drops.  2) Mild tinnitus, with likely some bilat hearing loss (excessive noise exposure): pt does not want any further testing for this or audiology referral at this time.  3) Hx of atypical CP (see last two office notes for details): ordered exercise treadmill stress test for risk stratification  today.    An After Visit Summary was printed and given to the patient.  FOLLOW UP: prn

## 2015-02-13 NOTE — Progress Notes (Signed)
Pre visit review using our clinic review tool, if applicable. No additional management support is needed unless otherwise documented below in the visit note. 

## 2015-03-05 ENCOUNTER — Encounter (HOSPITAL_COMMUNITY): Payer: BLUE CROSS/BLUE SHIELD

## 2015-03-15 ENCOUNTER — Telehealth (HOSPITAL_COMMUNITY): Payer: Self-pay

## 2015-03-15 NOTE — Telephone Encounter (Signed)
Encounter call. 

## 2015-03-17 HISTORY — PX: OTHER SURGICAL HISTORY: SHX169

## 2015-03-19 ENCOUNTER — Telehealth (HOSPITAL_COMMUNITY): Payer: Self-pay

## 2015-03-19 ENCOUNTER — Ambulatory Visit (INDEPENDENT_AMBULATORY_CARE_PROVIDER_SITE_OTHER): Payer: BLUE CROSS/BLUE SHIELD | Admitting: Nurse Practitioner

## 2015-03-19 ENCOUNTER — Encounter: Payer: Self-pay | Admitting: Nurse Practitioner

## 2015-03-19 VITALS — BP 132/84 | HR 88 | Temp 98.2°F | Ht 76.5 in | Wt 275.0 lb

## 2015-03-19 DIAGNOSIS — J069 Acute upper respiratory infection, unspecified: Secondary | ICD-10-CM | POA: Diagnosis not present

## 2015-03-19 NOTE — Patient Instructions (Signed)
You have a virus causing your symptoms. The average duration of cold symptoms is 14 days.  For nasal congestion & cough: Start daily sinus rinses (neilmed Sinus Rinse). Use 30 mg to 60 mg pseudoephedrine 2 to 3 times daily. Vicks vapor rub under nose to help breathe. For sore throat: Benzocaine throat lozenges for sore throat. Tylenol or ibuprophen for headache. Sip fluids every hour. Rest. If you are not feeling better in 10 days or develop fever or chest pain, call us for re-evaluation. Feel better!  Upper Respiratory Infection, Adult An upper respiratory infection (URI) is also sometimes known as the common cold. The upper respiratory tract includes the nose, sinuses, throat, trachea, and bronchi. Bronchi are the airways leading to the lungs. Most people improve within 1 week, but symptoms can last up to 2 weeks. A residual cough may last even longer.  CAUSES Many different viruses can infect the tissues lining the upper respiratory tract. The tissues become irritated and inflamed and often become very moist. Mucus production is also common. A cold is contagious. You can easily spread the virus to others by oral contact. This includes kissing, sharing a glass, coughing, or sneezing. Touching your mouth or nose and then touching a surface, which is then touched by another person, can also spread the virus. SYMPTOMS  Symptoms typically develop 1 to 3 days after you come in contact with a cold virus. Symptoms vary from person to person. They may include:  Runny nose.  Sneezing.  Nasal congestion.  Sinus irritation.  Sore throat.  Loss of voice (laryngitis).  Cough.  Fatigue.  Muscle aches.  Loss of appetite.  Headache.  Low-grade fever. DIAGNOSIS  You might diagnose your own cold based on familiar symptoms, since most people get a cold 2 to 3 times a year. Your caregiver can confirm this based on your exam. Most importantly, your caregiver can check that your symptoms are not  due to another disease such as strep throat, sinusitis, pneumonia, asthma, or epiglottitis. Blood tests, throat tests, and X-rays are not necessary to diagnose a common cold, but they may sometimes be helpful in excluding other more serious diseases. Your caregiver will decide if any further tests are required. RISKS AND COMPLICATIONS  You may be at risk for a more severe case of the common cold if you smoke cigarettes, have chronic heart disease (such as heart failure) or lung disease (such as asthma), or if you have a weakened immune system. The very young and very old are also at risk for more serious infections. Bacterial sinusitis, middle ear infections, and bacterial pneumonia can complicate the common cold. The common cold can worsen asthma and chronic obstructive pulmonary disease (COPD). Sometimes, these complications can require emergency medical care and may be life-threatening. PREVENTION  The best way to protect against getting a cold is to practice good hygiene. Avoid oral or hand contact with people with cold symptoms. Wash your hands often if contact occurs. There is no clear evidence that vitamin C, vitamin E, echinacea, or exercise reduces the chance of developing a cold. However, it is always recommended to get plenty of rest and practice good nutrition. TREATMENT  Treatment is directed at relieving symptoms. There is no cure. Antibiotics are not effective, because the infection is caused by a virus, not by bacteria. Treatment may include:  Increased fluid intake. Sports drinks offer valuable electrolytes, sugars, and fluids.  Breathing heated mist or steam (vaporizer or shower).  Eating chicken soup or other clear  broths, and maintaining good nutrition.  Getting plenty of rest.  Using gargles or lozenges for comfort.  Controlling fevers with ibuprofen or acetaminophen as directed by your caregiver.  Increasing usage of your inhaler if you have asthma. Zinc gel and zinc  lozenges, taken in the first 24 hours of the common cold, can shorten the duration and lessen the severity of symptoms. Pain medicines may help with fever, muscle aches, and throat pain. A variety of non-prescription medicines are available to treat congestion and runny nose. Your caregiver can make recommendations and may suggest nasal or lung inhalers for other symptoms.  HOME CARE INSTRUCTIONS   Only take over-the-counter or prescription medicines for pain, discomfort, or fever as directed by your caregiver.  Use a warm mist humidifier or inhale steam from a shower to increase air moisture. This may keep secretions moist and make it easier to breathe.  Drink enough water and fluids to keep your urine clear or pale yellow.  Rest as needed.  Return to work when your temperature has returned to normal or as your caregiver advises. You may need to stay home longer to avoid infecting others. You can also use a face mask and careful hand washing to prevent spread of the virus. SEEK MEDICAL CARE IF:   After the first few days, you feel you are getting worse rather than better.  You need your caregiver's advice about medicines to control symptoms.  You develop chills, worsening shortness of breath, or brown or red sputum. These may be signs of pneumonia.  You develop yellow or brown nasal discharge or pain in the face, especially when you bend forward. These may be signs of sinusitis.  You develop a fever, swollen neck glands, pain with swallowing, or white areas in the back of your throat. These may be signs of strep throat. SEEK IMMEDIATE MEDICAL CARE IF:   You have a fever.  You develop severe or persistent headache, ear pain, sinus pain, or chest pain.  You develop wheezing, a prolonged cough, cough up blood, or have a change in your usual mucus (if you have chronic lung disease).  You develop sore muscles or a stiff neck. Document Released: 04/28/2001 Document Revised: 01/25/2012  Document Reviewed: 03/06/2011 [facility] Patient Information 2014 [place], [place].

## 2015-03-19 NOTE — Progress Notes (Signed)
Pre visit review using our clinic review tool, if applicable. No additional management support is needed unless otherwise documented below in the visit note. 

## 2015-03-19 NOTE — Progress Notes (Signed)
   Subjective:    Patient ID: Adrian Cook, male    DOB: 04/25/1966, 49 y.o.   MRN: [id number]  HPI Comments: Pt is scheduled for cardiac stress test tomorrow. He asks if he should reschedule. I advised if he doesn't feel 100%, he may consider rescheduling.   Sinus Problem This is a new problem. The current episode started in the past 7 days (3d). The problem is unchanged. There has been no fever (chills 3 da). He is experiencing no pain. Associated symptoms include chills, congestion, coughing, sinus pressure and a sore throat. Pertinent negatives include no ear pain, headaches, hoarse voice, shortness of breath, sneezing or swollen glands. Past treatments include oral decongestants. The treatment provided mild relief.      Review of Systems  Constitutional: Positive for chills.  HENT: Positive for congestion, sinus pressure and sore throat. Negative for ear pain, hoarse voice and sneezing.   Respiratory: Positive for cough. Negative for shortness of breath.   Neurological: Negative for headaches.       Objective:   Physical Exam  Constitutional: He is oriented to person, place, and time. He appears well-developed and well-nourished. No distress.  HENT:  Head: Normocephalic and atraumatic.  Right Ear: External ear normal.  Left Ear: External ear normal.  Mouth/Throat: No oropharyngeal exudate.  Posterior pharynx slightly erythematous   Eyes: Conjunctivae are normal. Right eye exhibits no discharge. Left eye exhibits no discharge.  Neck: Normal range of motion. Neck supple. No thyromegaly present.  Cardiovascular: Normal rate, regular rhythm and normal heart sounds.   Pulmonary/Chest: Effort normal and breath sounds normal.  Lymphadenopathy:    He has no cervical adenopathy.  Neurological: He is alert and oriented to person, place, and time.  Skin: Skin is warm and dry.  Psychiatric: He has a normal mood and affect. His behavior is normal. Thought content normal.         Assessment & Plan:  1. Acute upper respiratory infection Symptoms management See pt instructions F/u PRN

## 2015-03-19 NOTE — Telephone Encounter (Signed)
Ebony to reschedule pt appointment.

## 2015-03-20 ENCOUNTER — Encounter (HOSPITAL_COMMUNITY): Payer: BLUE CROSS/BLUE SHIELD

## 2015-03-21 ENCOUNTER — Other Ambulatory Visit: Payer: Self-pay | Admitting: Nurse Practitioner

## 2015-03-21 ENCOUNTER — Telehealth: Payer: Self-pay

## 2015-03-21 DIAGNOSIS — R6889 Other general symptoms and signs: Secondary | ICD-10-CM

## 2015-03-21 MED ORDER — OSELTAMIVIR PHOSPHATE 75 MG PO CAPS: 75.0000 mg | ORAL_CAPSULE | Freq: Two times a day (BID) | ORAL | Status: DC

## 2015-03-21 NOTE — Telephone Encounter (Signed)
pls ask pt if any other new symptoms along w/fever. (cough, sore throat, chest pain w/inspiration, etc.)

## 2015-03-21 NOTE — Telephone Encounter (Signed)
Please Advise? Patient called stating that he is feeling worse than he was on Tuesday when he was seen. Fever now of 102. Patient was wondering if you would send something in for him?

## 2015-03-21 NOTE — Telephone Encounter (Signed)
Called and informed patient. 

## 2015-03-21 NOTE — Telephone Encounter (Signed)
Called and spoke with patient. States that his fever is doing better and he is not having any other symptoms.

## 2015-03-21 NOTE — Telephone Encounter (Signed)
He has probably had flu. He should continue symptoms management instructions. If he no longer has fever, no other meds are indicated. If still having fever, he can start tamiflu, although it is not necessary to fight flu, but may shorten illness by 1 day.  Tamiflu can cause HA & abdominal cramping . I will send in, he may fill if he wishes.

## 2015-04-10 ENCOUNTER — Telehealth (HOSPITAL_COMMUNITY): Payer: Self-pay

## 2015-04-10 NOTE — Telephone Encounter (Signed)
Encounter complete. 

## 2015-04-11 ENCOUNTER — Telehealth (HOSPITAL_COMMUNITY): Payer: Self-pay

## 2015-04-11 NOTE — Telephone Encounter (Signed)
Encounter complete. 

## 2015-04-12 ENCOUNTER — Encounter: Payer: Self-pay | Admitting: Family Medicine

## 2015-04-12 ENCOUNTER — Ambulatory Visit (HOSPITAL_COMMUNITY)
Admission: RE | Admit: 2015-04-12 | Discharge: 2015-04-12 | Disposition: A | Discharge status: Home / Self Care | Payer: BLUE CROSS/BLUE SHIELD | Source: Ambulatory Visit | Attending: Family Medicine | Admitting: Family Medicine

## 2015-04-12 DIAGNOSIS — R0789 Other chest pain: Secondary | ICD-10-CM | POA: Diagnosis not present

## 2015-04-12 DIAGNOSIS — R079 Chest pain, unspecified: Secondary | ICD-10-CM | POA: Diagnosis not present

## 2015-04-12 LAB — EXERCISE TOLERANCE TEST
Estimated workload: 11.7 METS
Exercise duration (min): 10 min
MPHR: 173 {beats}/min
Peak HR: 171 {beats}/min
Percent HR: 98 %
RPE: 28675
Rest HR: 77 {beats}/min

## 2016-03-09 DIAGNOSIS — M1712 Unilateral primary osteoarthritis, left knee: Secondary | ICD-10-CM | POA: Diagnosis not present

## 2016-03-09 DIAGNOSIS — M170 Bilateral primary osteoarthritis of knee: Secondary | ICD-10-CM | POA: Diagnosis not present

## 2016-03-09 DIAGNOSIS — M1711 Unilateral primary osteoarthritis, right knee: Secondary | ICD-10-CM | POA: Diagnosis not present

## 2016-03-17 DIAGNOSIS — M1711 Unilateral primary osteoarthritis, right knee: Secondary | ICD-10-CM | POA: Diagnosis not present

## 2016-03-17 DIAGNOSIS — M170 Bilateral primary osteoarthritis of knee: Secondary | ICD-10-CM | POA: Diagnosis not present

## 2016-03-17 DIAGNOSIS — M1712 Unilateral primary osteoarthritis, left knee: Secondary | ICD-10-CM | POA: Diagnosis not present

## 2016-03-23 DIAGNOSIS — M1712 Unilateral primary osteoarthritis, left knee: Secondary | ICD-10-CM | POA: Diagnosis not present

## 2016-03-23 DIAGNOSIS — M170 Bilateral primary osteoarthritis of knee: Secondary | ICD-10-CM | POA: Diagnosis not present

## 2016-03-23 DIAGNOSIS — M1711 Unilateral primary osteoarthritis, right knee: Secondary | ICD-10-CM | POA: Diagnosis not present

## 2016-05-04 DIAGNOSIS — N281 Cyst of kidney, acquired: Secondary | ICD-10-CM | POA: Diagnosis not present

## 2016-05-04 DIAGNOSIS — Q6102 Congenital multiple renal cysts: Secondary | ICD-10-CM | POA: Diagnosis not present

## 2016-05-04 DIAGNOSIS — N200 Calculus of kidney: Secondary | ICD-10-CM | POA: Diagnosis not present

## 2016-05-05 ENCOUNTER — Other Ambulatory Visit: Payer: Self-pay | Admitting: Urology

## 2016-05-05 DIAGNOSIS — N281 Cyst of kidney, acquired: Secondary | ICD-10-CM

## 2016-05-08 ENCOUNTER — Encounter: Payer: Self-pay | Admitting: Family Medicine

## 2016-05-25 ENCOUNTER — Ambulatory Visit (HOSPITAL_COMMUNITY)
Admission: RE | Admit: 2016-05-25 | Discharge: 2016-05-25 | Disposition: A | Discharge status: Home / Self Care | Payer: BLUE CROSS/BLUE SHIELD | Source: Ambulatory Visit | Attending: Urology | Admitting: Urology

## 2016-05-25 DIAGNOSIS — N281 Cyst of kidney, acquired: Secondary | ICD-10-CM | POA: Diagnosis not present

## 2016-05-25 DIAGNOSIS — N200 Calculus of kidney: Secondary | ICD-10-CM | POA: Insufficient documentation

## 2016-05-25 DIAGNOSIS — N289 Disorder of kidney and ureter, unspecified: Secondary | ICD-10-CM | POA: Insufficient documentation

## 2016-05-25 DIAGNOSIS — K7689 Other specified diseases of liver: Secondary | ICD-10-CM | POA: Insufficient documentation

## 2016-05-25 DIAGNOSIS — K8020 Calculus of gallbladder without cholecystitis without obstruction: Secondary | ICD-10-CM | POA: Diagnosis not present

## 2016-05-25 MED ORDER — GADOBENATE DIMEGLUMINE 529 MG/ML IV SOLN
20.0000 mL | Freq: Once | INTRAVENOUS | Status: AC | PRN
  Administered 2016-05-25: 20 mL via INTRAVENOUS

## 2017-10-16 DIAGNOSIS — E119 Type 2 diabetes mellitus without complications: Secondary | ICD-10-CM

## 2017-10-16 HISTORY — DX: Type 2 diabetes mellitus without complications: E11.9

## 2017-11-03 DIAGNOSIS — Z125 Encounter for screening for malignant neoplasm of prostate: Secondary | ICD-10-CM | POA: Diagnosis not present

## 2017-11-03 DIAGNOSIS — N200 Calculus of kidney: Secondary | ICD-10-CM | POA: Diagnosis not present

## 2017-11-19 ENCOUNTER — Encounter: Payer: BLUE CROSS/BLUE SHIELD | Admitting: Family Medicine

## 2017-11-19 NOTE — Progress Notes (Deleted)
Office Note 11/19/2017  CC: No chief complaint on file.  HPI:  Adrian Cook is a 52 y.o. male who is here for annual health maintenance exam.   Past Medical History:  Diagnosis Date  . Arthritis   . Atypical chest pain 2016   ETT 03/2015 NORMAL  . Congenital bilateral renal cysts    followed by urology: most recent u/s was 05/04/16.  MRI kidneys is going to be done to further evaluate this and set up a protocol for further eval.  . Constipation   . GERD (gastroesophageal reflux disease)   . Heart murmur    Found at age 52, w/u unrevealing.  No cardiac symptoms.  . Kidney stones   . Obesity, Class I, BMI 30-34.9   . OSA (obstructive sleep apnea) approx 2008   was put on CPAP but says he fell out of the habit of using it due to "laziness".  Has CPAP machine but doesn't use it.    Past Surgical History:  Procedure Laterality Date  . ANTERIOR CRUCIATE LIGAMENT REPAIR  1991   ACL and PCL left knee  . ETT  03/2015   NORMAL    Family History  Problem Relation Age of Onset  . Osteoarthritis Mother   . Arthritis Mother   . Heart failure Father   . Leukemia Maternal Grandfather   . Colon cancer Neg Hx   . Prostate cancer Neg Hx     Social History   Socioeconomic History  . Marital status: Married    Spouse name: Not on file  . Number of children: Not on file  . Years of education: Not on file  . Highest education level: Not on file  Social Needs  . Financial resource strain: Not on file  . Food insecurity - worry: Not on file  . Food insecurity - inability: Not on file  . Transportation needs - medical: Not on file  . Transportation needs - non-medical: Not on file  Occupational History  . Not on file  Tobacco Use  . Smoking status: Never Smoker  . Smokeless tobacco: Never Used  Substance and Sexual Activity  . Alcohol use: No    Alcohol/week: 0.0 oz  . Drug use: No  . Sexual activity: Not on file  Other Topics Concern  . Not on file  Social History  Narrative   Married, no children.   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   No T/A/Ds.    Outpatient Medications Prior to Visit  Medication Sig Dispense Refill  . NAPROXEN PO Take 220 mg by mouth as needed. OTC    . oseltamivir (TAMIFLU) 75 MG capsule Take 1 capsule (75 mg total) by mouth 2 (two) times daily. 10 capsule 0  . Polyethylene Glycol 3350 (MIRALAX PO) Take by mouth as needed. OTC    . potassium citrate (UROCIT-K) 10 MEQ (1080 MG) SR tablet      No facility-administered medications prior to visit.     Allergies  Allergen Reactions  . Amoxicillin Rash  . Ciprofloxacin Hcl Rash    ROS *** PE; There were no vitals taken for this visit. *** Pertinent labs:  Lab Results  Component Value Date   TSH 2.34 01/28/2015   Lab Results  Component Value Date   WBC 4.8 01/28/2015   HGB 14.3 01/28/2015   HCT 41.5 01/28/2015   MCV 89.2 01/28/2015   PLT 249.0 01/28/2015   Lab Results  Component Value Date  CREATININE 0.87 01/28/2015   BUN 19 01/28/2015   NA 140 01/28/2015   K 4.3 01/28/2015   CL 104 01/28/2015   CO2 31 01/28/2015   Lab Results  Component Value Date   ALT 25 01/28/2015   AST 17 01/28/2015   ALKPHOS 57 01/28/2015   BILITOT 0.9 01/28/2015   Lab Results  Component Value Date   CHOL 148 01/28/2015   Lab Results  Component Value Date   HDL 41.80 01/28/2015   Lab Results  Component Value Date   LDLCALC 82 01/28/2015   Lab Results  Component Value Date   TRIG 122.0 01/28/2015   Lab Results  Component Value Date   CHOLHDL 4 01/28/2015    ASSESSMENT AND PLAN:   Health maintenance exam: Reviewed age and gender appropriate health maintenance issues (prudent diet, regular exercise, health risks of tobacco and excessive alcohol, use of seatbelts, fire alarms in home, use of sunscreen).  Also reviewed age and gender appropriate health screening as well as vaccine recommendations. Vaccines: Labs: HP labs +  screening PSA ordered Prostate ca screening:  Colon ca screening:  An After Visit Summary was printed and given to the patient.   FOLLOW UP:  No Follow-up on file.  Signed:  Santiago Bumpers, MD           11/19/2017

## 2017-12-06 ENCOUNTER — Ambulatory Visit (INDEPENDENT_AMBULATORY_CARE_PROVIDER_SITE_OTHER): Payer: BLUE CROSS/BLUE SHIELD | Admitting: Family Medicine

## 2017-12-06 ENCOUNTER — Encounter: Payer: Self-pay | Admitting: Family Medicine

## 2017-12-06 VITALS — BP 142/80 | HR 79 | Temp 98.4°F | Resp 16 | Ht 77.5 in | Wt 270.0 lb

## 2017-12-06 DIAGNOSIS — Z0000 Encounter for general adult medical examination without abnormal findings: Secondary | ICD-10-CM

## 2017-12-06 DIAGNOSIS — Z23 Encounter for immunization: Secondary | ICD-10-CM

## 2017-12-06 DIAGNOSIS — Z125 Encounter for screening for malignant neoplasm of prostate: Secondary | ICD-10-CM

## 2017-12-06 DIAGNOSIS — E119 Type 2 diabetes mellitus without complications: Secondary | ICD-10-CM | POA: Diagnosis not present

## 2017-12-06 DIAGNOSIS — Z0001 Encounter for general adult medical examination with abnormal findings: Secondary | ICD-10-CM | POA: Diagnosis not present

## 2017-12-06 LAB — COMPREHENSIVE METABOLIC PANEL
ALBUMIN: 4.6 g/dL (ref 3.5–5.2)
ALT: 40 U/L (ref 0–53)
AST: 25 U/L (ref 0–37)
Alkaline Phosphatase: 75 U/L (ref 39–117)
BILIRUBIN TOTAL: 0.9 mg/dL (ref 0.2–1.2)
BUN: 15 mg/dL (ref 6–23)
CO2: 31 meq/L (ref 19–32)
CREATININE: 0.87 mg/dL (ref 0.40–1.50)
Calcium: 9.9 mg/dL (ref 8.4–10.5)
Chloride: 99 mEq/L (ref 96–112)
GFR: 98.54 mL/min (ref >60.00)
Glucose, Bld: 193 mg/dL — ABNORMAL HIGH (ref 70–99)
Potassium: 3.9 mEq/L (ref 3.5–5.1)
Sodium: 137 mEq/L (ref 135–145)
Total Protein: 6.9 g/dL (ref 6.0–8.3)

## 2017-12-06 LAB — CBC WITH DIFFERENTIAL/PLATELET
BASOS ABS: 0 10*3/uL (ref 0.0–0.1)
Basophils Relative: 0.9 % (ref 0.0–3.0)
EOS ABS: 0.2 10*3/uL (ref 0.0–0.7)
Eosinophils Relative: 3.2 % (ref 0.0–5.0)
HEMATOCRIT: 48.4 % (ref 39.0–52.0)
HEMOGLOBIN: 16.8 g/dL (ref 13.0–17.0)
Lymphocytes Relative: 22.1 % (ref 12.0–46.0)
Lymphs Abs: 1.1 10*3/uL (ref 0.7–4.0)
MCHC: 34.8 g/dL (ref 30.0–36.0)
MCV: 87.6 fl (ref 78.0–100.0)
MONOS PCT: 6.3 % (ref 3.0–12.0)
Monocytes Absolute: 0.3 10*3/uL (ref 0.1–1.0)
Neutro Abs: 3.3 10*3/uL (ref 1.4–7.7)
Neutrophils Relative %: 67.5 % (ref 43.0–77.0)
Platelets: 268 10*3/uL (ref 150.0–400.0)
RBC: 5.52 Mil/uL (ref 4.22–5.81)
RDW: 13.1 % (ref 11.5–15.5)
WBC: 4.9 10*3/uL (ref 4.0–10.5)

## 2017-12-06 LAB — MICROALBUMIN / CREATININE URINE RATIO
Creatinine,U: 160.5 mg/dL
Microalb Creat Ratio: 0.9 mg/g (ref 0.0–30.0)
Microalb, Ur: 1.5 mg/dL (ref 0.0–1.9)

## 2017-12-06 LAB — PSA: PSA: 1.06 ng/mL (ref 0.10–4.00)

## 2017-12-06 LAB — LIPID PANEL
CHOL/HDL RATIO: 4
Cholesterol: 169 mg/dL (ref 0–200)
HDL: 47.4 mg/dL (ref >39.00)
LDL Cholesterol: 91 mg/dL (ref 0–99)
NonHDL: 121.68
Triglycerides: 155 mg/dL — ABNORMAL HIGH (ref 0.0–149.0)
VLDL: 31 mg/dL (ref 0.0–40.0)

## 2017-12-06 LAB — TSH: TSH: 4.07 u[IU]/mL (ref 0.35–4.50)

## 2017-12-06 LAB — HEMOGLOBIN A1C: HEMOGLOBIN A1C: 8.7 % — AB (ref 4.6–6.5)

## 2017-12-06 MED ORDER — METFORMIN HCL 500 MG PO TABS
500.0000 mg | ORAL_TABLET | Freq: Two times a day (BID) | ORAL | 0 refills | Status: DC
Start: 2017-12-06 — End: 2017-12-30

## 2017-12-06 NOTE — Progress Notes (Signed)
Office Note 12/06/2017  CC:  Chief Complaint  Patient presents with  . Annual Exam    Pt is fasting.     HPI:  Adrian Cook is a 52 y.o. male who is here for annual health maintenance exam.  Eyes: last exam 3 weeks ago. Dental: preventatives q6 mo. Exercise: active at work but no formal exercise. Diet: not working on anything in particular.  At [employer] physical 11/04/17 he had glucosuria, so Hba1c was done and was 8.8%. No meds started, no nutritionist seen yet.  Past Medical History:  Diagnosis Date  . Arthritis   . Atypical chest pain 2016   ETT 03/2015 NORMAL  . Congenital bilateral renal cysts    followed by urology: most recent u/s was 05/04/16.  MRI kidneys is going to be done to further evaluate this and set up a protocol for further eval.  . Constipation   . Diabetes mellitus without complication (HCC) 10/2017   Dx'd at [facility] CPE  . GERD (gastroesophageal reflux disease)   . Heart murmur    Found at age 52, w/u unrevealing.  No cardiac symptoms.  . Kidney stones    [facility]: takes indapamide 1.25mg  qd  . Obesity, Class I, BMI 30-34.9   . OSA (obstructive sleep apnea) approx 2008   was put on CPAP but says he fell out of the habit of using it due to "laziness".  Has CPAP machine but doesn't use it.    Past Surgical History:  Procedure Laterality Date  . ANTERIOR CRUCIATE LIGAMENT REPAIR  1991   ACL and PCL left knee  . ETT  03/2015   NORMAL    Family History  Problem Relation Age of Onset  . Osteoarthritis Mother   . Arthritis Mother   . Heart failure Father   . Leukemia Maternal Grandfather   . Colon cancer Neg Hx   . Prostate cancer Neg Hx     Social History   Socioeconomic History  . Marital status: Married    Spouse name: Not on file  . Number of children: Not on file  . Years of education: Not on file  . Highest education level: Not on file  Social Needs  . Financial resource strain: Not on file  . Food insecurity - worry: Not on  file  . Food insecurity - inability: Not on file  . Transportation needs - medical: Not on file  . Transportation needs - non-medical: Not on file  Occupational History  . Not on file  Tobacco Use  . Smoking status: Never Smoker  . Smokeless tobacco: Never Used  Substance and Sexual Activity  . Alcohol use: No    Alcohol/week: 0.0 oz  . Drug use: No  . Sexual activity: Not on file  Other Topics Concern  . Not on file  Social History Narrative   Married, no children.   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   No T/A/Ds.    Outpatient Medications Prior to Visit  Medication Sig Dispense Refill  . indapamide (LOZOL) 1.25 MG tablet Take 1.25 mg by mouth every morning.  6  . NAPROXEN PO Take 220 mg by mouth as needed. OTC    . Potassium Citrate 15 MEQ (1620 MG) TBCR Take 1 tablet by mouth 2 (two) times daily.  11  . Probiotic Product (PROBIOTIC PO) Take 1 capsule by mouth daily.    Marland Kitchen. oseltamivir (TAMIFLU) 75 MG capsule Take 1 capsule (75 mg  total) by mouth 2 (two) times daily. (Patient not taking: Reported on 12/06/2017) 10 capsule 0  . Polyethylene Glycol 3350 (MIRALAX PO) Take by mouth as needed. OTC    . potassium citrate (UROCIT-K) 10 MEQ (1080 MG) SR tablet      No facility-administered medications prior to visit.     Allergies  Allergen Reactions  . Amoxicillin Rash  . Ciprofloxacin Hcl Rash    ROS Review of Systems  Constitutional: Negative for appetite change, chills, fatigue and fever.  HENT: Negative for congestion, dental problem, ear pain and sore throat.   Eyes: Negative for discharge, redness and visual disturbance.  Respiratory: Negative for cough, chest tightness, shortness of breath and wheezing.   Cardiovascular: Negative for chest pain, palpitations and leg swelling.  Gastrointestinal: Negative for abdominal pain, blood in stool, diarrhea, nausea and vomiting.  Endocrine: Negative for cold intolerance, heat intolerance,  polydipsia, polyphagia and polyuria.  Genitourinary: Negative for difficulty urinating, dysuria, flank pain, frequency, hematuria and urgency.  Musculoskeletal: Negative for arthralgias, back pain, joint swelling, myalgias and neck stiffness.  Skin: Negative for pallor and rash.  Neurological: Negative for dizziness, speech difficulty, weakness and headaches.  Hematological: Negative for adenopathy. Does not bruise/bleed easily.  Psychiatric/Behavioral: Negative for confusion and sleep disturbance. The patient is not nervous/anxious.     PE; Blood pressure (!) 142/80, pulse 79, temperature 98.4 F (36.9 C), temperature source Oral, resp. rate 16, height 6' 5.5" (1.969 m), weight 270 lb (122.5 kg), SpO2 98 %. Body mass index is 31.61 kg/m.  Gen: Alert, well appearing.  Patient is oriented to person, place, time, and situation. AFFECT: pleasant, lucid thought and speech. ENT: Ears: EACs clear, normal epithelium.  TMs with good light reflex and landmarks bilaterally.  Eyes: no injection, icteris, swelling, or exudate.  EOMI, PERRLA. Nose: no drainage or turbinate edema/swelling.  No injection or focal lesion.  Mouth: lips without lesion/swelling.  Oral mucosa pink and moist.  Dentition intact and without obvious caries or gingival swelling.  Oropharynx without erythema, exudate, or swelling.  Neck: supple/nontender.  No LAD, mass, or TM.  Carotid pulses 2+ bilaterally, without bruits. CV: RRR, no m/r/g.   LUNGS: CTA bilat, nonlabored resps, good aeration in all lung fields. ABD: soft, NT, ND, BS normal.  No hepatospenomegaly or mass.  No bruits. EXT: no clubbing, cyanosis, or edema.  Musculoskeletal: no joint swelling, erythema, warmth, or tenderness.  ROM of all joints intact. Skin - no sores or suspicious lesions or rashes or color changes Rectal exam: negative without mass, lesions or tenderness, PROSTATE EXAM: smooth and symmetric without nodules or tenderness.   Pertinent labs:  Lab  Results  Component Value Date   TSH 2.34 01/28/2015   Lab Results  Component Value Date   WBC 4.8 01/28/2015   HGB 14.3 01/28/2015   HCT 41.5 01/28/2015   MCV 89.2 01/28/2015   PLT 249.0 01/28/2015   Lab Results  Component Value Date   CREATININE 0.87 01/28/2015   BUN 19 01/28/2015   NA 140 01/28/2015   K 4.3 01/28/2015   CL 104 01/28/2015   CO2 31 01/28/2015   Lab Results  Component Value Date   ALT 25 01/28/2015   AST 17 01/28/2015   ALKPHOS 57 01/28/2015   BILITOT 0.9 01/28/2015   Lab Results  Component Value Date   CHOL 148 01/28/2015   Lab Results  Component Value Date   HDL 41.80 01/28/2015   Lab Results  Component Value Date  LDLCALC 82 01/28/2015   Lab Results  Component Value Date   TRIG 122.0 01/28/2015   Lab Results  Component Value Date   CHOLHDL 4 01/28/2015   No results found for: PSA   ASSESSMENT AND PLAN:   1) New dx DM 2: started metformin 500 mg bid. Urine microalb/cr today, HbA1c today. Feet exam next visit in 2 weeks. At that f/u visit we'll likely get him set up with glucometer/strips for home gluc monitoring. Further diabetes education to be done at f/u visit in 2 weeks.  2) Health maintenance exam: Reviewed age and gender appropriate health maintenance issues (prudent diet, regular exercise, health risks of tobacco and excessive alcohol, use of seatbelts, fire alarms in home, use of sunscreen).  Also reviewed age and gender appropriate health screening as well as vaccine recommendations. Vaccines: Tdap and Flu UTD.  Shingrix discussed--#1 today. Labs: fasting HP, HbA1c and urine microalb/cr (new dx DM) + PSA. Prostate ca screening:  DRE normal , PSA. Colon ca screening: due for first screening colonoscopy-- pt already has appt with Dr. Dulce Sellar. He is also having some GERD problems as well as recently dx'd gallstones noted on imaging done by his urlogist.  An After Visit Summary was printed and given to the patient.  FOLLOW  UP:  Return in about 2 weeks (around 12/20/2017) for f/u new dx DM 2 (30 min).  Signed:  Santiago Bumpers, MD           12/06/2017

## 2017-12-06 NOTE — Patient Instructions (Signed)

## 2017-12-07 ENCOUNTER — Encounter: Payer: Self-pay | Admitting: *Deleted

## 2017-12-07 DIAGNOSIS — K219 Gastro-esophageal reflux disease without esophagitis: Secondary | ICD-10-CM | POA: Diagnosis not present

## 2017-12-07 DIAGNOSIS — R109 Unspecified abdominal pain: Secondary | ICD-10-CM | POA: Diagnosis not present

## 2017-12-07 DIAGNOSIS — R1310 Dysphagia, unspecified: Secondary | ICD-10-CM | POA: Diagnosis not present

## 2017-12-07 DIAGNOSIS — Z1211 Encounter for screening for malignant neoplasm of colon: Secondary | ICD-10-CM | POA: Diagnosis not present

## 2017-12-27 ENCOUNTER — Ambulatory Visit: Payer: BLUE CROSS/BLUE SHIELD | Admitting: Family Medicine

## 2017-12-28 ENCOUNTER — Encounter: Payer: BLUE CROSS/BLUE SHIELD | Attending: Family Medicine | Admitting: *Deleted

## 2017-12-28 DIAGNOSIS — Z713 Dietary counseling and surveillance: Secondary | ICD-10-CM | POA: Diagnosis not present

## 2017-12-28 DIAGNOSIS — E119 Type 2 diabetes mellitus without complications: Secondary | ICD-10-CM | POA: Diagnosis not present

## 2017-12-28 DIAGNOSIS — Z6830 Body mass index (BMI) 30.0-30.9, adult: Secondary | ICD-10-CM | POA: Insufficient documentation

## 2017-12-28 NOTE — Patient Instructions (Signed)
Plan:  Aim for 4 Carb Choices per meal (60 grams) +/- 1 either way  Aim for 0-2 Carbs per snack if hungry  Include protein in moderation with your meals and snacks Consider reading food labels for Total Carbohydrate of foods Consider  increasing your activity level as tolerated Consider checking BG at alternate times per day if you decide to get a meter Continue taking medication as directed by MD

## 2017-12-30 ENCOUNTER — Other Ambulatory Visit: Payer: Self-pay

## 2017-12-30 MED ORDER — METFORMIN HCL 500 MG PO TABS: 500.0000 mg | ORAL_TABLET | Freq: Two times a day (BID) | ORAL | 0 refills | Status: DC

## 2017-12-30 NOTE — Progress Notes (Signed)
Diabetes Self-Management Education  Visit Type: First/Initial  Appt. Start Time: 1530 Appt. End Time: 1700  12/30/2017  Mr. Adrian Cook, identified by name and date of birth, is a 52 y.o. male with a diagnosis of Diabetes: Type 2. 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 Diet history reveals good variety of all food groups, but in portions that are inadequate for his height. He states weight loss of almost 10 pounds since diagnosed 2 months ago.   ASSESSMENT  Height 6\' 6"  (1.981 m), weight 267 lb 6.4 oz (121.3 kg). Body mass index is 30.9 kg/m.  Diabetes Self-Management Education - 12/28/17 1539      Visit Information   Visit Type  First/Initial      Initial Visit   Diabetes Type  Type 2    Are you currently following a meal plan?  No    Are you taking your medications as prescribed?  Yes    Date Diagnosed  11/04/2017      Health Coping   How would you rate your overall health?  Good      Psychosocial Assessment   Patient Belief/Attitude about Diabetes  Motivated to manage diabetes    Self-care barriers  None    Self-management support  Family    Other persons present  Patient    Patient Concerns  Nutrition/Meal planning    Learning Readiness  Change in progress    How often do you need to have someone help you when you read instructions, pamphlets, or other written materials from your doctor or pharmacy?  1 - Never    What is the last grade level you completed in school?  12      Pre-Education Assessment   Patient understands the diabetes disease and treatment process.  Needs Instruction    Patient understands incorporating nutritional management into lifestyle.  Needs Instruction    Patient undertands incorporating physical activity into lifestyle.  Needs Instruction    Patient understands using medications safely.  Needs Instruction    Patient understands monitoring blood glucose, interpreting and using results  Needs Instruction    Patient  understands prevention, detection, and treatment of acute complications.  Needs Instruction    Patient understands prevention, detection, and treatment of chronic complications.  Needs Instruction    Patient understands how to develop strategies to address psychosocial issues.  Needs Instruction    Patient understands how to develop strategies to promote health/change behavior.  Needs Instruction      Complications   Last HgB A1C per patient/outside source  8.8 %    How often do you check your blood sugar?  Not recommended by provider    Have you had a dilated eye exam in the past 12 months?  Yes    Have you had a dental exam in the past 12 months?  Yes    Are you checking your feet?  No      Dietary Intake   Breakfast  Cheerios with milk    Snack (morning)  package of Lance crackers and water    Lunch  [place]key sandwich, soup and crackers    Snack (afternoon)  has cut out cookies, may have nuts or protein bar    Dinner  meat, starch, vegetables    Snack (evening)  tries to not eat after supper meal, may have some ice cream on occasion    Beverage(s)  milk, water,       Exercise   Exercise Type  Light (walking / raking leaves) fairly active at work as Printmaker      Patient Education   Previous Diabetes Education  No    Disease state   Definition of diabetes, type 1 and 2, and the diagnosis of diabetes    Nutrition management   Role of diet in the treatment of diabetes and the relationship between the three main macronutrients and blood glucose level;Carbohydrate counting;Meal timing in regards to the patients' current diabetes medication.    Physical activity and exercise   Role of exercise on diabetes management, blood pressure control and cardiac health.    Medications  Reviewed patients medication for diabetes, action, purpose, timing of dose and side effects.    Monitoring  Purpose and frequency of SMBG.;Identified appropriate SMBG and/or A1C goals.    Chronic  complications  Relationship between chronic complications and blood glucose control    Psychosocial adjustment  Role of stress on diabetes      Individualized Goals (developed by patient)   Nutrition  Follow meal plan discussed    Physical Activity  Exercise 3-5 times per week    Medications  take my medication as prescribed    Monitoring   Not Applicable      Post-Education Assessment   Patient understands the diabetes disease and treatment process.  Demonstrates understanding / competency    Patient understands incorporating nutritional management into lifestyle.  Demonstrates understanding / competency    Patient undertands incorporating physical activity into lifestyle.  Demonstrates understanding / competency    Patient understands using medications safely.  Demonstrates understanding / competency    Patient understands monitoring blood glucose, interpreting and using results  Demonstrates understanding / competency    Patient understands prevention, detection, and treatment of acute complications.  Demonstrates understanding / competency    Patient understands prevention, detection, and treatment of chronic complications.  Demonstrates understanding / competency    Patient understands how to develop strategies to address psychosocial issues.  Demonstrates understanding / competency    Patient understands how to develop strategies to promote health/change behavior.  Demonstrates understanding / competency      Outcomes   Expected Outcomes  Demonstrated interest in learning. Expect positive outcomes    Future DMSE  PRN    Program Status  Completed       Individualized Plan for Diabetes Self-Management Training:   Learning Objective:  Patient will have a greater understanding of diabetes self-management. Patient education plan is to attend individual and/or group sessions per assessed needs and concerns.   Plan:   Patient Instructions  Plan:  Aim for 4 Carb Choices per meal (60  grams) +/- 1 either way  Aim for 0-2 Carbs per snack if hungry  Include protein in moderation with your meals and snacks Consider reading food labels for Total Carbohydrate of foods Consider  increasing your activity level as tolerated Consider checking BG at alternate times per day if you decide to get a meter Continue taking medication as directed by MD  Expected Outcomes:  Demonstrated interest in learning. Expect positive outcomes  Education material provided: Living Well with Diabetes, Meal plan card and Carbohydrate counting sheet  If problems or questions, patient to contact team via:  Phone  Future DSME appointment: PRN

## 2017-12-30 NOTE — Telephone Encounter (Signed)
Refill sent to pharmacy. Appt. 01/05/18.

## 2018-01-05 ENCOUNTER — Encounter: Payer: Self-pay | Admitting: Family Medicine

## 2018-01-05 ENCOUNTER — Ambulatory Visit: Payer: BLUE CROSS/BLUE SHIELD | Admitting: Family Medicine

## 2018-01-05 VITALS — BP 133/70 | HR 61 | Temp 98.7°F | Resp 16 | Ht 77.5 in | Wt 266.5 lb

## 2018-01-05 DIAGNOSIS — Z23 Encounter for immunization: Secondary | ICD-10-CM

## 2018-01-05 DIAGNOSIS — E119 Type 2 diabetes mellitus without complications: Secondary | ICD-10-CM | POA: Diagnosis not present

## 2018-01-05 NOTE — Addendum Note (Signed)
Addended by: Smitty KnudsenSUTHERLAND, Jamesetta Greenhalgh K on: 01/05/2018 09:19 AM   Modules accepted: Orders

## 2018-01-05 NOTE — Progress Notes (Signed)
OFFICE VISIT  01/05/2018   CC:  Chief Complaint  Patient presents with  . Follow-up    DM   HPI:    Patient is a 52 y.o.  male who presents for f/u of newly dx'd DM 2---dx'd about 2 mo ago at [facility] physical. I saw him 1 mo ago and started him on metformin 500 mg bid: A1c was 8.7% at that time. I referred him to nutritionist at that time as well.  Nutritionist visit went very well, making progress with this. No side effects from metformin. Hasn't started exercise yet.  His mom died 2 d/a---he is dealing with this ok/fine.  No acute c/o. No pain, tingling, or numbness in feet. No polyuria or polydipsia.  Past Medical History:  Diagnosis Date  . Arthritis   . Atypical chest pain 2016   ETT 03/2015 NORMAL  . Congenital bilateral renal cysts    followed by urology: most recent u/s was 05/04/16.  MRI kidneys is going to be done to further evaluate this and set up a protocol for further eval.  . Constipation   . Diabetes mellitus without complication (HCC) 10/2017   Dx'd at [facility] CPE  . Gallstones    pt to see Dr. Dulce Sellar 11/2017 for this  . GERD (gastroesophageal reflux disease)   . Heart murmur    Found at age 31, w/u unrevealing.  No cardiac symptoms.  . Kidney stones    [facility]: takes indapamide 1.25mg  qd  . Obesity, Class I, BMI 30-34.9   . OSA (obstructive sleep apnea) approx 2008   was put on CPAP but says he fell out of the habit of using it due to "laziness".  Has CPAP machine but doesn't use it.    Past Surgical History:  Procedure Laterality Date  . ANTERIOR CRUCIATE LIGAMENT REPAIR  1991   ACL and PCL left knee  . ETT  03/2015   NORMAL    Outpatient Medications Prior to Visit  Medication Sig Dispense Refill  . indapamide (LOZOL) 1.25 MG tablet Take 1.25 mg by mouth every morning.  6  . metFORMIN (GLUCOPHAGE) 500 MG tablet Take 1 tablet (500 mg total) by mouth 2 (two) times daily with a meal. 60 tablet 0  . NAPROXEN PO Take 220 mg by mouth as needed. OTC     . omeprazole (PRILOSEC) 40 MG capsule Take 1 capsule by mouth daily.  3  . Potassium Citrate 15 MEQ (1620 MG) TBCR Take 1 tablet by mouth 2 (two) times daily.  11  . Probiotic Product (PROBIOTIC PO) Take 1 capsule by mouth daily.     No facility-administered medications prior to visit.     Allergies  Allergen Reactions  . Amoxicillin Rash  . Ciprofloxacin Hcl Rash    ROS As per HPI  PE: Blood pressure 133/70, pulse 61, temperature 98.7 F (37.1 C), temperature source Oral, resp. rate 16, height 6' 5.5" (1.969 m), weight 266 lb 8 oz (120.9 kg), SpO2 97 %. Gen: Alert, well appearing.  Patient is oriented to person, place, time, and situation. AFFECT: pleasant, lucid thought and speech. Foot exam - bilateral normal; no swelling, tenderness or skin or vascular lesions. Color and temperature is normal. Sensation is intact. Peripheral pulses are palpable. Toenails are normal.   LABS:  Lab Results  Component Value Date   TSH 4.07 12/06/2017   Lab Results  Component Value Date   WBC 4.9 12/06/2017   HGB 16.8 12/06/2017   HCT 48.4 12/06/2017  MCV 87.6 12/06/2017   PLT 268.0 12/06/2017   Lab Results  Component Value Date   CREATININE 0.87 12/06/2017   BUN 15 12/06/2017   NA 137 12/06/2017   K 3.9 12/06/2017   CL 99 12/06/2017   CO2 31 12/06/2017   Lab Results  Component Value Date   ALT 40 12/06/2017   AST 25 12/06/2017   ALKPHOS 75 12/06/2017   BILITOT 0.9 12/06/2017   Lab Results  Component Value Date   CHOL 169 12/06/2017   Lab Results  Component Value Date   HDL 47.40 12/06/2017   Lab Results  Component Value Date   LDLCALC 91 12/06/2017   Lab Results  Component Value Date   TRIG 155.0 (H) 12/06/2017   Lab Results  Component Value Date   CHOLHDL 4 12/06/2017   Lab Results  Component Value Date   PSA 1.06 12/06/2017   Lab Results  Component Value Date   HGBA1C 8.7 (H) 12/06/2017    IMPRESSION AND PLAN:  DM 2, new dx 10/2017. Doing well  on metformin 500 mg bid---will continue this with dietary changes for now. Urine microalbumin/cr normal last visit. Feet exam today: normal. Next A1c due in 2 mo. If A1c not signif improved at 2 mo f/u, will do necessary dose adjustment and get him started with home glucose monitoring. Pt to scheduled diab retpthy screening exam. Pneumovax 23 given today. Will discuss evidence to add statin to regimen at his next f/u visit.  An After Visit Summary was printed and given to the patient.  FOLLOW UP: Return in about 2 months (around 03/05/2018) for routine chronic illness f/u.  Signed:  Santiago BumpersPhil McGowen, MD           01/05/2018

## 2018-01-31 DIAGNOSIS — R1011 Right upper quadrant pain: Secondary | ICD-10-CM | POA: Diagnosis not present

## 2018-01-31 DIAGNOSIS — K640 First degree hemorrhoids: Secondary | ICD-10-CM | POA: Diagnosis not present

## 2018-01-31 DIAGNOSIS — Z1211 Encounter for screening for malignant neoplasm of colon: Secondary | ICD-10-CM | POA: Diagnosis not present

## 2018-01-31 DIAGNOSIS — R1310 Dysphagia, unspecified: Secondary | ICD-10-CM | POA: Diagnosis not present

## 2018-02-01 ENCOUNTER — Other Ambulatory Visit: Payer: Self-pay | Admitting: Family Medicine

## 2018-02-01 DIAGNOSIS — D126 Benign neoplasm of colon, unspecified: Secondary | ICD-10-CM | POA: Diagnosis not present

## 2018-02-01 DIAGNOSIS — Z1211 Encounter for screening for malignant neoplasm of colon: Secondary | ICD-10-CM | POA: Diagnosis not present

## 2018-02-01 DIAGNOSIS — K648 Other hemorrhoids: Secondary | ICD-10-CM | POA: Diagnosis not present

## 2018-02-01 HISTORY — PX: COLONOSCOPY: SHX174

## 2018-02-01 LAB — HM COLONOSCOPY

## 2018-02-04 DIAGNOSIS — Z1211 Encounter for screening for malignant neoplasm of colon: Secondary | ICD-10-CM | POA: Diagnosis not present

## 2018-02-04 DIAGNOSIS — D126 Benign neoplasm of colon, unspecified: Secondary | ICD-10-CM | POA: Diagnosis not present

## 2018-02-10 ENCOUNTER — Encounter: Payer: Self-pay | Admitting: Family Medicine

## 2018-02-10 ENCOUNTER — Encounter: Payer: Self-pay | Admitting: *Deleted

## 2018-02-12 DIAGNOSIS — M25561 Pain in right knee: Secondary | ICD-10-CM | POA: Diagnosis not present

## 2018-02-12 DIAGNOSIS — M25562 Pain in left knee: Secondary | ICD-10-CM | POA: Diagnosis not present

## 2018-02-12 DIAGNOSIS — M1711 Unilateral primary osteoarthritis, right knee: Secondary | ICD-10-CM | POA: Diagnosis not present

## 2018-02-12 DIAGNOSIS — M1712 Unilateral primary osteoarthritis, left knee: Secondary | ICD-10-CM | POA: Diagnosis not present

## 2018-03-07 ENCOUNTER — Encounter: Payer: Self-pay | Admitting: Family Medicine

## 2018-03-07 ENCOUNTER — Ambulatory Visit: Payer: BLUE CROSS/BLUE SHIELD | Admitting: Family Medicine

## 2018-03-07 VITALS — BP 119/72 | HR 65 | Temp 98.2°F | Resp 16 | Ht 77.5 in | Wt 269.0 lb

## 2018-03-07 DIAGNOSIS — E119 Type 2 diabetes mellitus without complications: Secondary | ICD-10-CM

## 2018-03-07 DIAGNOSIS — Z23 Encounter for immunization: Secondary | ICD-10-CM

## 2018-03-07 LAB — POCT GLYCOSYLATED HEMOGLOBIN (HGB A1C): HEMOGLOBIN A1C: 6.7

## 2018-03-07 NOTE — Progress Notes (Signed)
OFFICE VISIT  03/07/2018   CC:  Chief Complaint  Patient presents with  . Follow-up    RCI, pt is not fasting.      HPI:    Patient is a 52 y.o. Caucasian male who presents for 2 mo f/u DM 2 (dx 10/2017). Has been tolerating metformin 500 mg bid, made some dietary changes before last f/u visit. Due for A1c today.  DM: no home glucose monitoring. He has been taking glucophage as rx'd and eating diabetic diet consistently.  No acute complaints.  Feeling well.  Past Medical History:  Diagnosis Date  . Arthritis   . Atypical chest pain 2016   ETT 03/2015 NORMAL  . Congenital bilateral renal cysts    followed by urology: most recent u/s was 05/04/16.  MRI kidneys is going to be done to further evaluate this and set up a protocol for further eval.  . Constipation   . Diabetes mellitus without complication (HCC) 10/2017   Dx'd at [facility] CPE  . Gallstones    pt to see Dr. Dulce Sellarutlaw 11/2017 for this  . GERD (gastroesophageal reflux disease)   . Heart murmur    Found at age 52, w/u unrevealing.  No cardiac symptoms.  . Kidney stones    [facility]: takes indapamide 1.25mg  qd  . Obesity, Class I, BMI 30-34.9   . OSA (obstructive sleep apnea) approx 2008   was put on CPAP but says he fell out of the habit of using it due to "laziness".  Has CPAP machine but doesn't use it.    Past Surgical History:  Procedure Laterality Date  . ANTERIOR CRUCIATE LIGAMENT REPAIR  1991   ACL and PCL left knee  . COLONOSCOPY  02/01/2018   Tubular adenoma, repeat 5 years   . ETT  03/2015   NORMAL    Outpatient Medications Prior to Visit  Medication Sig Dispense Refill  . indapamide (LOZOL) 1.25 MG tablet Take 1.25 mg by mouth every morning.  6  . metFORMIN (GLUCOPHAGE) 500 MG tablet TAKE 1 TABLET (500 MG TOTAL) BY MOUTH 2 (TWO) TIMES DAILY WITH A MEAL. 60 tablet 3  . NAPROXEN PO Take 220 mg by mouth as needed. OTC    . omeprazole (PRILOSEC) 40 MG capsule Take 1 capsule by mouth daily.  3  .  Potassium Citrate 15 MEQ (1620 MG) TBCR Take 1 tablet by mouth 2 (two) times daily.  11   No facility-administered medications prior to visit.     Allergies  Allergen Reactions  . Amoxicillin Rash  . Ciprofloxacin Hcl Rash    ROS As per HPI  PE: Blood pressure 119/72, pulse 65, temperature 98.2 F (36.8 C), temperature source Oral, resp. rate 16, height 6' 5.5" (1.969 m), weight 269 lb (122 kg), SpO2 92 %. Body mass index is 31.49 kg/m.  Gen: Alert, well appearing.  Patient is oriented to person, place, time, and situation. AFFECT: pleasant, lucid thought and speech. No further exam today.  LABS:  Lab Results  Component Value Date   TSH 4.07 12/06/2017   Lab Results  Component Value Date   WBC 4.9 12/06/2017   HGB 16.8 12/06/2017   HCT 48.4 12/06/2017   MCV 87.6 12/06/2017   PLT 268.0 12/06/2017   Lab Results  Component Value Date   CREATININE 0.87 12/06/2017   BUN 15 12/06/2017   NA 137 12/06/2017   K 3.9 12/06/2017   CL 99 12/06/2017   CO2 31 12/06/2017   Lab Results  Component Value Date   ALT 40 12/06/2017   AST 25 12/06/2017   ALKPHOS 75 12/06/2017   BILITOT 0.9 12/06/2017   Lab Results  Component Value Date   CHOL 169 12/06/2017   Lab Results  Component Value Date   HDL 47.40 12/06/2017   Lab Results  Component Value Date   LDLCALC 91 12/06/2017   Lab Results  Component Value Date   TRIG 155.0 (H) 12/06/2017   Lab Results  Component Value Date   CHOLHDL 4 12/06/2017   Lab Results  Component Value Date   PSA 1.06 12/06/2017   Lab Results  Component Value Date   HGBA1C 8.7 (H) 12/06/2017   POC HbA1c today= 6.7%  IMPRESSION AND PLAN:  DM 2, w/out complication. New dx 4 mo ago. Compliant with metformin 500 mg bid + diabetic diet. Reminded pt of need for diab retpthy screening exam. HbA1c today is much improved/at goal (6.7%)--continue current regimen.  Discussed use of statin in pt's with DM today: The results of the Heart  Protection Study indicate that patients with diabetes who take a "statin" (cholesterol lowering medication like lipitor, for example) have lower risk of major cardiovascular events INDEPENDENT of initial LDL cholesterol ("bad cholesterol"). Consider starting a statin medication in the near future. Pt to consider statin.   An After Visit Summary was printed and given to the patient.  FOLLOW UP: Return in about 3 months (around 06/06/2018) for routine chronic illness f/u.  Signed:  Santiago Bumpers, MD           03/07/2018

## 2018-03-07 NOTE — Patient Instructions (Signed)
The results of the Heart Protection Study indicate that patients with diabetes who take a "statin" (cholesterol lowering medication like lipitor, for example) have lower risk of major cardiovascular events INDEPENDENT of initial LDL cholesterol ("bad cholesterol). Consider starting a statin medication in the near future.

## 2018-03-14 ENCOUNTER — Encounter: Payer: Self-pay | Admitting: Family Medicine

## 2018-03-22 ENCOUNTER — Encounter: Payer: Self-pay | Admitting: Family Medicine

## 2018-03-22 ENCOUNTER — Ambulatory Visit: Payer: BLUE CROSS/BLUE SHIELD | Admitting: Family Medicine

## 2018-03-22 VITALS — BP 134/77 | HR 75 | Temp 98.3°F | Resp 16 | Ht 77.5 in | Wt 262.0 lb

## 2018-03-22 DIAGNOSIS — J069 Acute upper respiratory infection, unspecified: Secondary | ICD-10-CM | POA: Diagnosis not present

## 2018-03-22 DIAGNOSIS — J209 Acute bronchitis, unspecified: Secondary | ICD-10-CM | POA: Diagnosis not present

## 2018-03-22 NOTE — Patient Instructions (Addendum)
Get otc generic robitussin DM OR Mucinex DM and use as directed on the packaging for cough and congestion. Use otc generic saline nasal spray 2-3 times per day to irrigate/moisturize your nasal passages.  Use tylenol or motrin for aches.  Drink lots of clear fluids.

## 2018-03-22 NOTE — Progress Notes (Signed)
OFFICE VISIT  03/22/2018   CC:  Chief Complaint  Patient presents with  . Cough    x 2 days   HPI:    Patient is a 52 y.o.  male nonsmoker who presents for cough, onset 2 d/a.  Felt subjective fever yesterday. Scratchy throat, runny nose/congested nose, PND.  Slight HA. Mild fatigue, mild achy body.   No wheeze, no chest tightness, no SOB. .  Past Medical History:  Diagnosis Date  . Arthritis    bilat knees--visco supplementation helpful in ths pas; most recent course of this planned as of 01/2018 eval by Dr. Penni Bombard.  . Atypical chest pain 2016   ETT 03/2015 NORMAL  . Congenital bilateral renal cysts    followed by urology: most recent u/s was 05/04/16.  MRI kidneys is going to be done to further evaluate this and set up a protocol for further eval.  . Constipation   . Diabetes mellitus without complication (HCC) 10/2017   [facility]  . Gallstones    pt to see Dr. Dulce Sellar 11/2017 for this  . GERD (gastroesophageal reflux disease)   . Heart murmur    Found at age 32, w/u unrevealing.  No cardiac symptoms.  . Kidney stones    [facility]: takes indapamide 1.25mg  qd  . Obesity, Class I, BMI 30-34.9   . OSA (obstructive sleep apnea) approx 2008   was put on CPAP but says he fell out of the habit of using it due to "laziness".  Has CPAP machine but doesn't use it.    Past Surgical History:  Procedure Laterality Date  . ANTERIOR CRUCIATE LIGAMENT REPAIR  1991   ACL and PCL left knee  . COLONOSCOPY  02/01/2018   Tubular adenoma, repeat 5 years   . ETT  03/2015   NORMAL   Social History   Socioeconomic History  . Marital status: Married    Spouse name: Not on file  . Number of children: Not on file  . Years of education: Not on file  . Highest education level: Not on file  Occupational History  . Not on file  Social Needs  . Financial resource strain: Not on file  . Food insecurity:    Worry: Not on file    Inability: Not on file  . Transportation needs:   Medical: Not on file    Non-medical: Not on file  Tobacco Use  . Smoking status: Never Smoker  . Smokeless tobacco: Never Used  Substance and Sexual Activity  . Alcohol use: No    Alcohol/week: 0.0 oz  . Drug use: No  . Sexual activity: Not on file  Lifestyle  . Physical activity:    Days per week: Not on file    Minutes per session: Not on file  . Stress: Not on file  Relationships  . Social connections:    Talks on phone: Not on file    Gets together: Not on file    Attends religious service: Not on file    Active member of club or organization: Not on file    Attends meetings of clubs or organizations: Not on file    Relationship status: Not on file  Other Topics Concern  . Not on file  Social History Narrative   Married, no children.   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   No T/A/Ds.     Outpatient Medications Prior to Visit  Medication Sig Dispense Refill  .  indapamide (LOZOL) 1.25 MG tablet Take 1.25 mg by mouth every morning.  6  . metFORMIN (GLUCOPHAGE) 500 MG tablet TAKE 1 TABLET (500 MG TOTAL) BY MOUTH 2 (TWO) TIMES DAILY WITH A MEAL. 60 tablet 3  . NAPROXEN PO Take 220 mg by mouth as needed. OTC    . omeprazole (PRILOSEC) 40 MG capsule Take 1 capsule by mouth daily.  3  . Potassium Citrate 15 MEQ (1620 MG) TBCR Take 1 tablet by mouth 2 (two) times daily.  11   No facility-administered medications prior to visit.     Allergies  Allergen Reactions  . Amoxicillin Rash  . Ciprofloxacin Hcl Rash    ROS As per HPI  PE: Blood pressure 134/77, pulse 75, temperature 98.3 F (36.8 C), temperature source Oral, resp. rate 16, height 6' 5.5" (1.969 m), weight 262 lb (118.8 kg), SpO2 96 %. VS: noted--normal. Gen: alert, NAD, NONTOXIC APPEARING. HEENT: eyes without injection, drainage, or swelling.  Ears: EACs clear, TMs with normal light reflex and landmarks.  Nose: Clear rhinorrhea, with some dried, crusty exudate adherent to  mildly injected mucosa.  No purulent d/c.  No paranasal sinus TTP.  No facial swelling.  Throat and mouth without focal lesion.  No pharyngial swelling, erythema, or exudate.   Neck: supple, no LAD.   LUNGS: He has some mild insp/exp rhonchi diffusely, but with a couple of forceful coughs his lungs then sound CTA bilat.  Nonlabored resps.   CV: RRR, no m/r/g. EXT: no c/c/e SKIN: no rash    LABS:  None today  IMPRESSION AND PLAN:  URI with acute bronchitis, no sign of RAD. Viral etiology suspected. Symptomatic care discussed. Get otc generic robitussin DM OR Mucinex DM and use as directed on the packaging for cough and congestion. Use otc generic saline nasal spray 2-3 times per day to irrigate/moisturize your nasal passages.  Signs/symptoms to call or return for were reviewed and pt expressed understanding.  An After Visit Summary was printed and given to the patient.  FOLLOW UP: Return if symptoms worsen or fail to improve.  Signed:  Santiago Bumpers, MD           03/22/2018

## 2018-05-23 DIAGNOSIS — N200 Calculus of kidney: Secondary | ICD-10-CM | POA: Diagnosis not present

## 2018-06-01 ENCOUNTER — Other Ambulatory Visit: Payer: Self-pay | Admitting: Family Medicine

## 2018-06-06 ENCOUNTER — Encounter: Payer: Self-pay | Admitting: Family Medicine

## 2018-06-06 ENCOUNTER — Ambulatory Visit: Payer: BLUE CROSS/BLUE SHIELD | Admitting: Family Medicine

## 2018-06-06 VITALS — BP 114/71 | HR 83 | Temp 98.5°F | Resp 16 | Ht 77.5 in | Wt 263.1 lb

## 2018-06-06 DIAGNOSIS — E119 Type 2 diabetes mellitus without complications: Secondary | ICD-10-CM

## 2018-06-06 MED ORDER — ATORVASTATIN CALCIUM 20 MG PO TABS
20.0000 mg | ORAL_TABLET | Freq: Every day | ORAL | 3 refills | Status: DC
Start: 2018-06-06 — End: 2018-09-19

## 2018-06-06 NOTE — Progress Notes (Signed)
OFFICE VISIT  06/06/2018   CC:  Chief Complaint  Patient presents with  . Follow-up    RCI, pt is not fasitng.     HPI:    Patient is a 52 y.o.  male who presents for 3 mo f/u DM.  No home gluc monitoring. Compliant with metformin w/out side effects. Diabetic diet pretty good.  No polyuria or polydipsia. He discussed statin trial with his wife and he says he'll try it (he did wonder about price and side effect profile, which we discussed today).  Past Medical History:  Diagnosis Date  . Arthritis    bilat knees--visco supplementation helpful in ths pas; most recent course of this planned as of 01/2018 eval by Dr. Penni Bombard.  . Atypical chest pain 2016   ETT 03/2015 NORMAL  . Congenital bilateral renal cysts    followed by urology: most recent u/s was 05/04/16.  MRI kidneys is going to be done to further evaluate this and set up a protocol for further eval.  . Constipation   . Diabetes mellitus without complication (HCC) 10/2017   [facility]  . Gallstones    pt to see Dr. Dulce Sellar 11/2017 for this  . GERD (gastroesophageal reflux disease)   . Heart murmur    Found at age 79, w/u unrevealing.  No cardiac symptoms.  . Kidney stones    [facility]: takes indapamide 1.25mg  qd  . Obesity, Class I, BMI 30-34.9   . OSA (obstructive sleep apnea) approx 2008   was put on CPAP but says he fell out of the habit of using it due to "laziness".  Has CPAP machine but doesn't use it.    Past Surgical History:  Procedure Laterality Date  . ANTERIOR CRUCIATE LIGAMENT REPAIR  1991   ACL and PCL left knee  . COLONOSCOPY  02/01/2018   Tubular adenoma, repeat 5 years   . ETT  03/2015   NORMAL    Outpatient Medications Prior to Visit  Medication Sig Dispense Refill  . indapamide (LOZOL) 1.25 MG tablet Take 1.25 mg by mouth every morning.  6  . metFORMIN (GLUCOPHAGE) 500 MG tablet TAKE 1 TABLET (500 MG TOTAL) BY MOUTH 2 (TWO) TIMES DAILY WITH A MEAL. 180 tablet 1  . NAPROXEN PO Take 220  mg by mouth as needed. OTC    . omeprazole (PRILOSEC) 40 MG capsule Take 1 capsule by mouth daily.  3  . Potassium Citrate 15 MEQ (1620 MG) TBCR Take 1 tablet by mouth 2 (two) times daily.  11  . sildenafil (REVATIO) 20 MG tablet TAKE 2-5 TABLETS BY MOUTH 1 HOUR BEFORE INTERCOURSE.  11   No facility-administered medications prior to visit.     Allergies  Allergen Reactions  . Amoxicillin Rash  . Ciprofloxacin Hcl Rash    ROS As per HPI  PE: Blood pressure 114/71, pulse 83, temperature 98.5 F (36.9 C), temperature source Oral, resp. rate 16, height 6' 5.5" (1.969 m), weight 263 lb 2 oz (119.4 kg), SpO2 96 %. Body mass index is 30.8 kg/m.  Gen: Alert, well appearing.  Patient is oriented to person, place, time, and situation. AFFECT: pleasant, lucid thought and speech. No further exam today.  LABS:  Lab Results  Component Value Date   TSH 4.07 12/06/2017   Lab Results  Component Value Date   WBC 4.9 12/06/2017   HGB 16.8 12/06/2017   HCT 48.4 12/06/2017   MCV 87.6 12/06/2017   PLT 268.0 12/06/2017  Lab Results  Component Value Date   CREATININE 0.87 12/06/2017   BUN 15 12/06/2017   NA 137 12/06/2017   K 3.9 12/06/2017   CL 99 12/06/2017   CO2 31 12/06/2017   Lab Results  Component Value Date   ALT 40 12/06/2017   AST 25 12/06/2017   ALKPHOS 75 12/06/2017   BILITOT 0.9 12/06/2017   Lab Results  Component Value Date   CHOL 169 12/06/2017   Lab Results  Component Value Date   HDL 47.40 12/06/2017   Lab Results  Component Value Date   LDLCALC 91 12/06/2017   Lab Results  Component Value Date   TRIG 155.0 (H) 12/06/2017   Lab Results  Component Value Date   CHOLHDL 4 12/06/2017   Lab Results  Component Value Date   PSA 1.06 12/06/2017   Lab Results  Component Value Date   HGBA1C 6.7 03/07/2018    IMPRESSION AND PLAN:  DM 2:  Doing well on metformin and diet. BMET and HbA1c today. Eye exam due--pt reminded today and he says he  understands the importance of this. He is open to starting a statin (based on the Heart Protection Study), so I rx'd atorvastatin 20mg  qd today. We'll recheck FLP at next f/u visit.  An After Visit Summary was printed and given to the patient.  FOLLOW UP: Return in about 3 months (around 09/06/2018) for routine chronic illness f/u.   Signed:  Santiago BumpersPhil McGowen, MD           06/06/2018

## 2018-06-07 LAB — BASIC METABOLIC PANEL
BUN: 22 mg/dL (ref 6–23)
CHLORIDE: 104 meq/L (ref 96–112)
CO2: 30 mEq/L (ref 19–32)
Calcium: 9.8 mg/dL (ref 8.4–10.5)
Creatinine, Ser: 0.96 mg/dL (ref 0.40–1.50)
GFR: 87.78 mL/min (ref >60.00)
Glucose, Bld: 143 mg/dL — ABNORMAL HIGH (ref 70–99)
POTASSIUM: 3.8 meq/L (ref 3.5–5.1)
SODIUM: 142 meq/L (ref 135–145)

## 2018-06-07 LAB — HEMOGLOBIN A1C: HEMOGLOBIN A1C: 7 % — AB (ref 4.6–6.5)

## 2018-06-08 MED ORDER — METFORMIN HCL 1000 MG PO TABS: 1000.0000 mg | ORAL_TABLET | Freq: Two times a day (BID) | ORAL | 3 refills | Status: DC

## 2018-06-08 NOTE — Addendum Note (Signed)
Addended by: Regan RakersAY, Parrish Daddario K on: 06/08/2018 10:57 AM   Modules accepted: Orders

## 2018-07-26 DIAGNOSIS — M25561 Pain in right knee: Secondary | ICD-10-CM | POA: Diagnosis not present

## 2018-07-26 DIAGNOSIS — M1712 Unilateral primary osteoarthritis, left knee: Secondary | ICD-10-CM | POA: Insufficient documentation

## 2018-07-26 DIAGNOSIS — M1711 Unilateral primary osteoarthritis, right knee: Secondary | ICD-10-CM | POA: Insufficient documentation

## 2018-07-26 DIAGNOSIS — M25562 Pain in left knee: Secondary | ICD-10-CM | POA: Diagnosis not present

## 2018-08-14 ENCOUNTER — Encounter: Payer: Self-pay | Admitting: Family Medicine

## 2018-08-31 DIAGNOSIS — M216X1 Other acquired deformities of right foot: Secondary | ICD-10-CM | POA: Diagnosis not present

## 2018-08-31 DIAGNOSIS — M79672 Pain in left foot: Secondary | ICD-10-CM | POA: Diagnosis not present

## 2018-08-31 DIAGNOSIS — M79671 Pain in right foot: Secondary | ICD-10-CM | POA: Diagnosis not present

## 2018-08-31 DIAGNOSIS — M216X2 Other acquired deformities of left foot: Secondary | ICD-10-CM | POA: Diagnosis not present

## 2018-09-07 ENCOUNTER — Ambulatory Visit: Payer: BLUE CROSS/BLUE SHIELD | Admitting: Family Medicine

## 2018-09-07 ENCOUNTER — Encounter: Payer: Self-pay | Admitting: Family Medicine

## 2018-09-07 VITALS — BP 126/75 | HR 92 | Temp 98.4°F | Resp 16 | Ht 77.5 in | Wt 264.5 lb

## 2018-09-07 DIAGNOSIS — E119 Type 2 diabetes mellitus without complications: Secondary | ICD-10-CM

## 2018-09-07 DIAGNOSIS — Z23 Encounter for immunization: Secondary | ICD-10-CM

## 2018-09-07 NOTE — Progress Notes (Signed)
OFFICE VISIT  09/07/2018   CC:  Chief Complaint  Patient presents with  . Follow-up    RCI, pt is not fasting.     HPI:    Patient is a 52 y.o.  male who presents for f/u DM 2, HLD.  DM: no home glucose monitoring.  No polydipsia or polyuria. I started him on atorva 20mg  qd at last visit 3 mo ago based on diabetic status. No side effects. Has fallen off a good diabetic diet lately.   Active at work, otherwise no exercise.   Past Medical History:  Diagnosis Date  . Atypical chest pain 2016   ETT 03/2015 NORMAL  . 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  No mass effect or enhancing lesions as of 2019 MRI.  Marland Kitchen Constipation   . Diabetes mellitus without complication (HCC) 10/2017   [facility]'d [facility] C[facility]  . ED (erectile dysfunction)    Sildenafil trial by urol 05/2018.  . Gallstones    pt to see Dr. Dulce Sellar 11/2017 for this  . GERD (gastroesophageal reflux disease)   . Heart murmur    Found at age 17, w/u unrevealing.  No cardiac symptoms.  . Kidney stones    [facility]: takes indapamide 1.25mg  qd + K cit bid.   Metabolic stone dz: renal leak hypercalcuria.  . Obesity, Class I, BMI 30-34.9   . OSA (obstructive sleep apnea) approx 2008   was put on CPAP but says he fell out of the habit of using it due to "laziness".  Has CPAP machine but doesn't use it.  . Osteoarthritis, multiple sites    Hands and right foot and both knees.  Ortho started meloxicam 08/2018.  Marland Kitchen Primary osteoarthritis of both knees    Vsco supplementation helpful in ths past; another course of this is planned as of 07/2018 eval by Dr. Penni Bombard.  . Prostate cancer screening    Urol did DRE (wnl) and PSA 05/24/18.    Past Surgical History:  Procedure Laterality Date  . ANTERIOR CRUCIATE LIGAMENT REPAIR  1991   ACL and PCL left knee  . COLONOSCOPY  02/01/2018   Tubular adenoma, repeat 5 years   . ETT  03/2015   NORMAL     Outpatient Medications Prior to Visit  Medication Sig Dispense Refill  . atorvastatin (LIPITOR) 20 MG tablet Take 1 tablet (20 mg total) by mouth daily. 30 tablet 3  . indapamide (LOZOL) 1.25 MG tablet Take 1.25 mg by mouth every morning.  6  . meloxicam (MOBIC) 7.5 MG tablet Take 1 tablet by mouth 2 (two) times daily.    . metFORMIN (GLUCOPHAGE) 1000 MG tablet Take 1 tablet (1,000 mg total) by mouth 2 (two) times daily with a meal. 60 tablet 3  . omeprazole (PRILOSEC) 40 MG capsule Take 1 capsule by mouth daily.  3  . Potassium Citrate 15 MEQ (1620 MG) TBCR Take 1 tablet by mouth 2 (two) times daily.  11  . sildenafil (REVATIO) 20 MG tablet TAKE 2-5 TABLETS BY MOUTH 1 HOUR BEFORE INTERCOURSE.  11  . NAPROXEN PO Take 220 mg by mouth as needed. OTC     No facility-administered medications prior to visit.     Allergies  Allergen Reactions  . Amoxicillin Rash  . Ciprofloxacin Hcl Rash    ROS As per HPI  PE: Blood pressure 126/75, pulse 92, temperature 98.4 F (36.9 C), temperature source Oral,  resp. rate 16, height 6' 5.5" (1.969 m), weight 264 lb 8 oz (120 kg), SpO2 95 %. Gen: Alert, well appearing.  Patient is oriented to person, place, time, and situation. AFFECT: pleasant, lucid thought and speech. No further exam today.  LABS:    Chemistry      Component Value Date/Time   NA 142 06/06/2018 1610   K 3.8 06/06/2018 1610   CL 104 06/06/2018 1610   CO2 30 06/06/2018 1610   BUN 22 06/06/2018 1610   CREATININE 0.96 06/06/2018 1610      Component Value Date/Time   CALCIUM 9.8 06/06/2018 1610   ALKPHOS 75 12/06/2017 0843   AST 25 12/06/2017 0843   ALT 40 12/06/2017 0843   BILITOT 0.9 12/06/2017 0843     Lab Results  Component Value Date   CHOL 169 12/06/2017   HDL 47.40 12/06/2017   LDLCALC 91 12/06/2017   TRIG 155.0 (H) 12/06/2017   CHOLHDL 4 12/06/2017   Lab Results  Component Value Date   HGBA1C 7.0 (H) 06/06/2018    IMPRESSION AND PLAN:  DM 2: no  home monitoring at this time--this is ok. Needs to improve diet and exercise habits. Has eye exam set for 11/03/18. Needs FLP, CMET, and A1c, which he'll return for when fasting--ordered future today. Flu vaccine today.  An After Visit Summary was printed and given to the patient.  FOLLOW UP: Return in about 3 months (around 12/08/2018) for routine chronic illness f/u.  Also, pt needs fasting lab appt.  Signed:  Santiago Bumpers, MD           09/07/2018

## 2018-09-13 ENCOUNTER — Telehealth: Payer: Self-pay | Admitting: *Deleted

## 2018-09-13 ENCOUNTER — Other Ambulatory Visit (INDEPENDENT_AMBULATORY_CARE_PROVIDER_SITE_OTHER): Payer: BLUE CROSS/BLUE SHIELD

## 2018-09-13 DIAGNOSIS — E119 Type 2 diabetes mellitus without complications: Secondary | ICD-10-CM

## 2018-09-13 NOTE — Telephone Encounter (Signed)
Opened in error

## 2018-09-14 LAB — COMPREHENSIVE METABOLIC PANEL
ALBUMIN: 4.5 g/dL (ref 3.5–5.2)
ALK PHOS: 64 U/L (ref 39–117)
ALT: 34 U/L (ref 0–53)
AST: 25 U/L (ref 0–37)
BILIRUBIN TOTAL: 0.9 mg/dL (ref 0.2–1.2)
BUN: 20 mg/dL (ref 6–23)
CALCIUM: 9.8 mg/dL (ref 8.4–10.5)
CO2: 31 meq/L (ref 19–32)
CREATININE: 0.94 mg/dL (ref 0.40–1.50)
Chloride: 102 mEq/L (ref 96–112)
GFR: 89.85 mL/min (ref >60.00)
Glucose, Bld: 95 mg/dL (ref 70–99)
Potassium: 3.6 mEq/L (ref 3.5–5.1)
Sodium: 141 mEq/L (ref 135–145)
TOTAL PROTEIN: 6.5 g/dL (ref 6.0–8.3)

## 2018-09-14 LAB — LIPID PANEL
CHOL/HDL RATIO: 2
Cholesterol: 102 mg/dL (ref 0–200)
HDL: 44.1 mg/dL (ref >39.00)
LDL CALC: 45 mg/dL (ref 0–99)
NONHDL: 58.11
TRIGLYCERIDES: 66 mg/dL (ref 0.0–149.0)
VLDL: 13.2 mg/dL (ref 0.0–40.0)

## 2018-09-14 LAB — HEMOGLOBIN A1C: Hgb A1c MFr Bld: 6.7 % — ABNORMAL HIGH (ref 4.6–6.5)

## 2018-09-19 ENCOUNTER — Other Ambulatory Visit: Payer: Self-pay | Admitting: Family Medicine

## 2018-09-25 ENCOUNTER — Encounter: Payer: Self-pay | Admitting: Family Medicine

## 2018-10-03 ENCOUNTER — Other Ambulatory Visit: Payer: Self-pay | Admitting: Family Medicine

## 2018-11-03 DIAGNOSIS — E119 Type 2 diabetes mellitus without complications: Secondary | ICD-10-CM | POA: Diagnosis not present

## 2018-11-03 LAB — HM DIABETES EYE EXAM

## 2018-11-21 ENCOUNTER — Encounter: Payer: Self-pay | Admitting: Family Medicine

## 2018-12-07 ENCOUNTER — Encounter: Payer: Self-pay | Admitting: *Deleted

## 2018-12-08 ENCOUNTER — Encounter: Payer: Self-pay | Admitting: Family Medicine

## 2018-12-08 ENCOUNTER — Ambulatory Visit: Payer: BLUE CROSS/BLUE SHIELD | Admitting: Family Medicine

## 2018-12-08 VITALS — BP 138/80 | HR 75 | Temp 97.7°F | Resp 16 | Ht 77.5 in | Wt 270.5 lb

## 2018-12-08 DIAGNOSIS — M150 Primary generalized (osteo)arthritis: Secondary | ICD-10-CM

## 2018-12-08 DIAGNOSIS — E119 Type 2 diabetes mellitus without complications: Secondary | ICD-10-CM

## 2018-12-08 DIAGNOSIS — M159 Polyosteoarthritis, unspecified: Secondary | ICD-10-CM

## 2018-12-08 DIAGNOSIS — R030 Elevated blood-pressure reading, without diagnosis of hypertension: Secondary | ICD-10-CM

## 2018-12-08 DIAGNOSIS — K219 Gastro-esophageal reflux disease without esophagitis: Secondary | ICD-10-CM

## 2018-12-08 LAB — POCT GLYCOSYLATED HEMOGLOBIN (HGB A1C): HEMOGLOBIN A1C: 6.8 % — AB (ref 4.0–5.6)

## 2018-12-08 MED ORDER — OMEPRAZOLE 40 MG PO CPDR: 40.0000 mg | DELAYED_RELEASE_CAPSULE | Freq: Every day | ORAL | 3 refills | Status: DC

## 2018-12-08 MED ORDER — MELOXICAM 7.5 MG PO TABS: 7.5000 mg | ORAL_TABLET | Freq: Two times a day (BID) | ORAL | 1 refills | Status: DC

## 2018-12-08 NOTE — Progress Notes (Signed)
OFFICE VISIT  12/08/2018   CC:  Chief Complaint  Patient presents with  . Follow-up    DM F/U. Pt is fasting      HPI:    Patient is a 53 y.o.  male who presents for 3 mo f/u DM 2, HLD, GERD, and chronic bilat feet pain and bilat knee pain (osteoarthritis and cavovarus deformity bilat feet).   Doing pretty well with diab diet. No home gluc monitoring. Home bp usually 120s/70s.  He has had a hectic day-->systolic 138 here today. Tolerating statin well.  Takes mobic once a day for bilat knee and bilat feet pain, rx'd by ortho and it helps.  Needs RF. GI rx'd PPI qd, pt denies any signif GER sx's.  Eating better, pt needs RF.  Past Medical History:  Diagnosis Date  . Atypical chest pain 2016   ETT 03/2015 NORMAL  . 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  . 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  No mass effect or enhancing lesions as of 2019 MRI.  Marland Kitchen Constipation   . Diabetes mellitus without complication (HCC) 10/2017   [facility]  . ED (erectile dysfunction)    Sildenafil trial by urol 05/2018.  . Gallstones    pt to see Dr. Dulce Sellar 11/2017 for this  . GERD (gastroesophageal reflux disease)   . Heart murmur    Found at age 44, w/u unrevealing.  No cardiac symptoms.  . Kidney stones    [facility]: takes indapamide 1.25mg  qd + K cit bid.   Metabolic stone dz: renal leak hypercalcuria.  . Obesity, Class I, BMI 30-34.9   . OSA (obstructive sleep apnea) approx 2008   was put on CPAP but says he fell out of the habit of using it due to "laziness".  Has CPAP machine but doesn't use it.  . Osteoarthritis, multiple sites    Hands and right foot and both knees.  Ortho started meloxicam 08/2018.  Marland Kitchen Primary osteoarthritis of both knees    Vsco supplementation helpful in ths  past; another course of this is planned as of 07/2018 eval by Dr. Penni Bombard.  . Prostate cancer screening    Urol did DRE (wnl) and PSA 05/24/18.    Past Surgical History:  Procedure Laterality Date  . ANTERIOR CRUCIATE LIGAMENT REPAIR  1991   ACL and PCL left knee  . COLONOSCOPY  02/01/2018   Tubular adenoma, repeat 5 years   . ETT  03/2015   NORMAL    Outpatient Medications Prior to Visit  Medication Sig Dispense Refill  . atorvastatin (LIPITOR) 20 MG tablet TAKE 1 TABLET BY MOUTH EVERY DAY 90 tablet 1  . indapamide (LOZOL) 1.25 MG tablet Take 1.25 mg by mouth every morning.  6  . metFORMIN (GLUCOPHAGE) 1000 MG tablet TAKE 1 TABLET (1,000 MG TOTAL) BY MOUTH 2 (TWO) TIMES DAILY WITH A MEAL. 180 tablet 1  . Potassium Citrate 15 MEQ (1620 MG) TBCR Take 1 tablet by mouth 2 (two) times daily.  11  . sildenafil (REVATIO) 20 MG tablet TAKE 2-5 TABLETS BY MOUTH 1 HOUR BEFORE INTERCOURSE.  11  . meloxicam (MOBIC) 7.5 MG tablet Take 1 tablet by mouth 2 (two) times daily.    Marland Kitchen omeprazole (PRILOSEC) 40 MG capsule Take 1 capsule by mouth daily.  3  No facility-administered medications prior to visit.     Allergies  Allergen Reactions  . Amoxicillin Rash  . Ciprofloxacin Hcl Rash    ROS As per HPI  PE: Blood pressure 138/80, pulse 75, temperature 97.7 F (36.5 C), temperature source Oral, resp. rate 16, height 6' 5.5" (1.969 m), weight 270 lb 8 oz (122.7 kg), SpO2 97 %. Gen: Alert, well appearing.  Patient is oriented to person, place, time, and situation. AFFECT: pleasant, lucid thought and speech. No further exam today.  LABS:  Lab Results  Component Value Date   TSH 4.07 12/06/2017   Lab Results  Component Value Date   WBC 4.9 12/06/2017   HGB 16.8 12/06/2017   HCT 48.4 12/06/2017   MCV 87.6 12/06/2017   PLT 268.0 12/06/2017   Lab Results  Component Value Date   CREATININE 0.94 09/13/2018   BUN 20 09/13/2018   NA 141 09/13/2018   K 3.6 09/13/2018   CL 102 09/13/2018    CO2 31 09/13/2018   Lab Results  Component Value Date   ALT 34 09/13/2018   AST 25 09/13/2018   ALKPHOS 64 09/13/2018   BILITOT 0.9 09/13/2018   Lab Results  Component Value Date   CHOL 102 09/13/2018   Lab Results  Component Value Date   HDL 44.10 09/13/2018   Lab Results  Component Value Date   LDLCALC 45 09/13/2018   Lab Results  Component Value Date   TRIG 66.0 09/13/2018   Lab Results  Component Value Date   CHOLHDL 2 09/13/2018   Lab Results  Component Value Date   PSA 1.06 12/06/2017   Lab Results  Component Value Date   HGBA1C 6.7 (H) 09/13/2018    IMPRESSION AND PLAN:  1) DM 2, doing well on diet and med compliance. No home glucose monitoring and that's fine at this point. HbA1c today: 6.8%.  No changes. Will get urine microalb/cr today.  2) Hypercholesterolemia: great response to statin last check 3 mo ago. Will recheck this annually.  3) Elev bp w/out dx of HTN: mild elev of systolic here today. Normal bp's with home monitoring.  No meds at this time. Lytes/cr normal consistently, most recently 3 months ago. Will recheck BMET at next f/u in 3 mo.  4) Bilat chronic feet pain and bilat osteoarthritis of knees: ortho has him on meloxicam 7.5mg  bid chronically and he finds this helpful.  Will RF this today.  5) GERD: reviewed GER diet.  I refilled his omeprazole 40mg  qd today.  An After Visit Summary was printed and given to the patient.  FOLLOW UP: Return in about 3 months (around 03/09/2019).  Signed:  Santiago Bumpers, MD           12/08/2018

## 2018-12-09 LAB — MICROALBUMIN / CREATININE URINE RATIO
CREATININE, U: 51.7 mg/dL
Microalb Creat Ratio: 1.4 mg/g (ref 0.0–30.0)

## 2019-02-06 ENCOUNTER — Ambulatory Visit: Payer: BLUE CROSS/BLUE SHIELD | Admitting: Family Medicine

## 2019-02-27 ENCOUNTER — Encounter: Payer: Self-pay | Admitting: Family Medicine

## 2019-02-27 ENCOUNTER — Ambulatory Visit (INDEPENDENT_AMBULATORY_CARE_PROVIDER_SITE_OTHER): Payer: BLUE CROSS/BLUE SHIELD | Admitting: Family Medicine

## 2019-02-27 ENCOUNTER — Other Ambulatory Visit: Payer: Self-pay

## 2019-02-27 DIAGNOSIS — E7800 Pure hypercholesterolemia, unspecified: Secondary | ICD-10-CM | POA: Diagnosis not present

## 2019-02-27 DIAGNOSIS — E119 Type 2 diabetes mellitus without complications: Secondary | ICD-10-CM

## 2019-02-27 NOTE — Progress Notes (Signed)
Virtual Visit via Video Note  I connected with pt  on 02/27/19 at  3:30 PM EDT by a video enabled telemedicine application and verified that I am speaking with the correct person using two identifiers.  Location patient: home Location provider:work or home office Persons participating in the virtual visit: patient, provider  I discussed the limitations of evaluation and management by telemedicine and the availability of in person appointments. The patient expressed understanding and agreed to proceed.  Telemedicine visit is a necessity given the COVID-19 restrictions in place at the current time.  HPI: 53 y/o WM being seen today for almost 3 mo f/u DM. No problems, feeling well. Compliant with metformin and atorvastatin w/out side effects. He does not do home glucose monitoriing. Eats diabetic diet "here and there".  Takes meloxicam once every morning for chronic knee pains.  Working in the yard more lately but o/w no exercise.   ROS: See pertinent positives and negatives per HPI.  Past Medical History:  Diagnosis Date  . Atypical chest pain 2016   ETT 03/2015 NORMAL  . 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  . 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  No mass effect or enhancing lesions as of 2019 MRI.  Marland Kitchen. Constipation   . Diabetes mellitus without complication (HCC) 10/2017   Dx'd at [facility] CPE  . ED (erectile dysfunction)    Sildenafil trial by urol 05/2018.  . Gallstones    asymptomatic  . GERD (gastroesophageal reflux disease)    Dr. Dulce Sellarutlaw  . Heart murmur    Found at age 53, w/u unrevealing.  No cardiac symptoms.  . Kidney stones    [facility]: takes indapamide 1.25mg  qd + K cit bid.   Metabolic stone dz: renal leak hypercalcuria.  . Obesity, Class I, BMI 30-34.9   .  OSA (obstructive sleep apnea) approx 2008   was put on CPAP but says he fell out of the habit of using it due to "laziness".  Has CPAP machine but doesn't use it.  . Osteoarthritis, multiple sites    Hands and right foot and both knees.  Ortho started meloxicam 08/2018.  Marland Kitchen. Primary osteoarthritis of both knees    Vsco supplementation helpful in ths past.  . Prostate cancer screening    Urol did DRE (wnl) and PSA 05/24/18.    Past Surgical History:  Procedure Laterality Date  . ANTERIOR CRUCIATE LIGAMENT REPAIR  1991   ACL and PCL left knee  . COLONOSCOPY  02/01/2018   Tubular adenoma, repeat 5 years   . ETT  03/2015   NORMAL    Family History  Problem Relation Age of Onset  . Osteoarthritis Mother   . Arthritis Mother   . Heart failure Father   . Leukemia Maternal Grandfather   . Colon cancer Neg Hx   . Prostate cancer Neg Hx     SOCIAL HX: Married, no children. 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 No T/A/Ds.   Current Outpatient Medications:  .  atorvastatin (LIPITOR) 20 MG tablet, TAKE 1 TABLET BY MOUTH EVERY DAY, Disp: 90 tablet, Rfl: 1 .  indapamide (LOZOL) 1.25 MG tablet, Take 1.25 mg by mouth every morning., Disp: , Rfl: 6 .  meloxicam (MOBIC) 7.5 MG tablet, Take 1 tablet (7.5 mg total) by mouth 2 (two) times  daily., Disp: 180 tablet, Rfl: 1 .  metFORMIN (GLUCOPHAGE) 1000 MG tablet, TAKE 1 TABLET (1,000 MG TOTAL) BY MOUTH 2 (TWO) TIMES DAILY WITH A MEAL., Disp: 180 tablet, Rfl: 1 .  omeprazole (PRILOSEC) 40 MG capsule, Take 1 capsule (40 mg total) by mouth daily., Disp: 90 capsule, Rfl: 3 .  Potassium Citrate 15 MEQ (1620 MG) TBCR, Take 1 tablet by mouth 2 (two) times daily., Disp: , Rfl: 11 .  sildenafil (REVATIO) 20 MG tablet, TAKE 2-5 TABLETS BY MOUTH 1 HOUR BEFORE INTERCOURSE., Disp: , Rfl: 11  EXAM:  VITALS per patient if applicable:  GENERAL: alert, oriented, appears well and in no acute distress  HEENT: atraumatic, conjunttiva clear, no obvious  abnormalities on inspection of external nose and ears  NECK: normal movements of the head and neck  LUNGS: on inspection no signs of respiratory distress, breathing rate appears normal, no obvious gross SOB, gasping or wheezing  CV: no obvious cyanosis  MS: moves all visible extremities without noticeable abnormality  PSYCH/NEURO: pleasant and cooperative, no obvious depression or anxiety, speech and thought processing grossly intact  LABS: none today  Lab Results  Component Value Date   TSH 4.07 12/06/2017   Lab Results  Component Value Date   WBC 4.9 12/06/2017   HGB 16.8 12/06/2017   HCT 48.4 12/06/2017   MCV 87.6 12/06/2017   PLT 268.0 12/06/2017   Lab Results  Component Value Date   CREATININE 0.94 09/13/2018   BUN 20 09/13/2018   NA 141 09/13/2018   K 3.6 09/13/2018   CL 102 09/13/2018   CO2 31 09/13/2018   Lab Results  Component Value Date   ALT 34 09/13/2018   AST 25 09/13/2018   ALKPHOS 64 09/13/2018   BILITOT 0.9 09/13/2018   Lab Results  Component Value Date   CHOL 102 09/13/2018   Lab Results  Component Value Date   HDL 44.10 09/13/2018   Lab Results  Component Value Date   LDLCALC 45 09/13/2018   Lab Results  Component Value Date   TRIG 66.0 09/13/2018   Lab Results  Component Value Date   CHOLHDL 2 09/13/2018   Lab Results  Component Value Date   PSA 1.06 12/06/2017   Lab Results  Component Value Date   HGBA1C 6.8 (A) 12/08/2018    ASSESSMENT AND PLAN:  Discussed the following assessment and plan:  1) DM 2. Tolerating metformin 1000 mg bid. Needs to improve diabetic diet and exercise. He'll come in for lab visit to get A1c and BMET in about 2-3 wks (it is too early for repeat A1c at this time).  2) HLD: tolerating atorva 20mg  qd well. Needs to work harder on improving diet and exercise/wt loss. Last lipid panel 08/2018 EXCELLENT.  AST/ALT normal at that time as well.   I discussed the assessment and treatment plan with  the patient. The patient was provided an opportunity to ask questions and all were answered. The patient agreed with the plan and demonstrated an understanding of the instructions.   The patient was advised to call back or seek an in-person evaluation if the symptoms worsen or if the condition fails to improve as anticipated.  F/u: 4 mo CPE Lab visit in 2-3 wks to check BMET and A1c.  Signed:  Santiago Bumpers, MD           02/27/2019

## 2019-03-22 ENCOUNTER — Other Ambulatory Visit: Payer: Self-pay

## 2019-03-22 MED ORDER — ATORVASTATIN CALCIUM 20 MG PO TABS: 20.0000 mg | ORAL_TABLET | Freq: Every day | ORAL | 0 refills | Status: DC

## 2019-04-06 ENCOUNTER — Other Ambulatory Visit: Payer: Self-pay | Admitting: Family Medicine

## 2019-06-19 ENCOUNTER — Other Ambulatory Visit: Payer: Self-pay | Admitting: Family Medicine

## 2019-08-10 ENCOUNTER — Telehealth: Payer: Self-pay | Admitting: Family Medicine

## 2019-08-10 NOTE — Telephone Encounter (Signed)
Medication has not been prescribed by you. Labs were last done 09/13/18. He has an appt on 10/8.  Please advise on refill.

## 2019-08-10 NOTE — Telephone Encounter (Signed)
Patient refill request  Potassium Citrate 15 MEQ (1620 MG) TBCR [[id number]]   [facility] appt on 08/24/19 - Patient does not have enough meds to last until next appt  [id number] - [place]

## 2019-08-13 MED ORDER — POTASSIUM CITRATE ER 15 MEQ (1620 MG) PO TBCR: 1.0000 | EXTENDED_RELEASE_TABLET | Freq: Two times a day (BID) | ORAL | 3 refills | Status: DC

## 2019-08-13 NOTE — Telephone Encounter (Signed)
Duplicate request

## 2019-08-24 ENCOUNTER — Encounter: Payer: Self-pay | Admitting: Family Medicine

## 2019-08-24 ENCOUNTER — Other Ambulatory Visit: Payer: Self-pay

## 2019-08-24 ENCOUNTER — Other Ambulatory Visit: Payer: Self-pay | Admitting: Family Medicine

## 2019-08-24 ENCOUNTER — Ambulatory Visit (INDEPENDENT_AMBULATORY_CARE_PROVIDER_SITE_OTHER): Payer: BC Managed Care – PPO | Admitting: Family Medicine

## 2019-08-24 VITALS — BP 143/87 | HR 72 | Temp 98.4°F | Resp 16 | Ht 77.5 in | Wt 266.2 lb

## 2019-08-24 DIAGNOSIS — E1142 Type 2 diabetes mellitus with diabetic polyneuropathy: Secondary | ICD-10-CM

## 2019-08-24 DIAGNOSIS — E7800 Pure hypercholesterolemia, unspecified: Secondary | ICD-10-CM | POA: Diagnosis not present

## 2019-08-24 DIAGNOSIS — R030 Elevated blood-pressure reading, without diagnosis of hypertension: Secondary | ICD-10-CM

## 2019-08-24 DIAGNOSIS — E119 Type 2 diabetes mellitus without complications: Secondary | ICD-10-CM | POA: Diagnosis not present

## 2019-08-24 DIAGNOSIS — Z0000 Encounter for general adult medical examination without abnormal findings: Secondary | ICD-10-CM | POA: Diagnosis not present

## 2019-08-24 DIAGNOSIS — Z125 Encounter for screening for malignant neoplasm of prostate: Secondary | ICD-10-CM | POA: Diagnosis not present

## 2019-08-24 DIAGNOSIS — Z1211 Encounter for screening for malignant neoplasm of colon: Secondary | ICD-10-CM

## 2019-08-24 DIAGNOSIS — H60503 Unspecified acute noninfective otitis externa, bilateral: Secondary | ICD-10-CM | POA: Diagnosis not present

## 2019-08-24 DIAGNOSIS — Z23 Encounter for immunization: Secondary | ICD-10-CM | POA: Diagnosis not present

## 2019-08-24 MED ORDER — NEOMYCIN-POLYMYXIN-HC 3.5-10000-1 OT SOLN: 4.0000 [drp] | Freq: Three times a day (TID) | OTIC | 0 refills | Status: DC

## 2019-08-24 NOTE — Progress Notes (Signed)
Office Note 08/24/2019  CC:  Chief Complaint  Patient presents with  . Annual Exam    pt is fasting   HPI:  Adrian Cook is a [age] y.o. male who is here for annual health maintenance exam. I last saw him 6 mo ago via telemedicine platform and all was stable.  DM: he does not monitor glucoses at home.  Compliant with med. No polydipsia.  Passed some kidney stones recently. Feet: has started feeling some mild tingling and numbness in distal 1/2 of plantar surfaces, L>R. No calluses.    HTN: no home bp checks but a check at his employer's health clinic was 120/78 recently.  Both ears hurting last week or so, L getting better, R getting worse.  No drainage. Says they feel swollen.  No hay fever or URI sx's. Using otc drops.  Past Medical History:  Diagnosis Date  . Atypical chest pain 2016   ETT 03/2015 NORMAL  . 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  . 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  No mass effect or enhancing lesions as of 2019 MRI.  Marland Kitchen. Constipation   . Diabetes mellitus without complication (HCC) 10/2017   [facility]  . ED (erectile dysfunction)    Sildenafil trial by urol 05/2018.  . Gallstones    asymptomatic  . GERD (gastroesophageal reflux disease)    Dr. Dulce Sellarutlaw  . Heart murmur    Found at age 89, w/u unrevealing.  No cardiac symptoms.  . Kidney stones    [facility]: takes indapamide 1.25mg  qd + K cit bid.   Metabolic stone dz: renal leak hypercalcuria.  . Obesity, Class I, BMI 30-34.9   . OSA (obstructive sleep apnea) approx 2008   was put on CPAP but says he fell out of the habit of using it due to "laziness".  Has CPAP machine but doesn't use it.  . Osteoarthritis, multiple sites    Hands and right foot and both knees.  Ortho started  meloxicam 08/2018.  Marland Kitchen. Primary osteoarthritis of both knees    Vsco supplementation helpful in ths past.  . Prostate cancer screening    Urol did DRE (wnl) and PSA 05/24/18.    Past Surgical History:  Procedure Laterality Date  . ANTERIOR CRUCIATE LIGAMENT REPAIR  1991   ACL and PCL left knee  . COLONOSCOPY  02/01/2018   Tubular adenoma, repeat 5 years   . ETT  03/2015   NORMAL    Family History  Problem Relation Age of Onset  . Osteoarthritis Mother   . Arthritis Mother   . Heart failure Father   . Leukemia Maternal Grandfather   . Colon cancer Neg Hx   . Prostate cancer Neg Hx     Social History   Socioeconomic History  . Marital status: Married    Spouse name: Not on file  . Number of children: Not on file  . Years of education: Not on file  . Highest education level: Not on file  Occupational History  . Not on file  Social Needs  . Financial resource strain: Not on file  . Food insecurity    Worry: Not on file    Inability: Not on file  . Transportation needs    Medical: Not on file    Non-medical: Not  on file  Tobacco Use  . Smoking status: Never Smoker  . Smokeless tobacco: Never Used  Substance and Sexual Activity  . Alcohol use: No    Alcohol/week: 0.0 standard drinks  . Drug use: No  . Sexual activity: Not on file  Lifestyle  . Physical activity    Days per week: Not on file    Minutes per session: Not on file  . Stress: Not on file  Relationships  . Social Herbalist on phone: Not on file    Gets together: Not on file    Attends religious service: Not on file    Active member of club or organization: Not on file    Attends meetings of clubs or organizations: Not on file    Relationship status: Not on file  . Intimate partner violence    Fear of current or ex partner: Not on file    Emotionally abused: Not on file    Physically abused: Not on file    Forced sexual activity: Not on file  Other Topics Concern  . Not on file  Social  History Narrative   Married, no children.   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   No T/A/Ds.    Outpatient Medications Prior to Visit  Medication Sig Dispense Refill  . atorvastatin (LIPITOR) 20 MG tablet TAKE 1 TABLET BY MOUTH EVERY DAY 90 tablet 0  . indapamide (LOZOL) 1.25 MG tablet Take 1.25 mg by mouth every morning.  6  . meloxicam (MOBIC) 7.5 MG tablet Take 1 tablet (7.5 mg total) by mouth 2 (two) times daily. 180 tablet 1  . metFORMIN (GLUCOPHAGE) 1000 MG tablet TAKE 1 TABLET (1,000 MG TOTAL) BY MOUTH 2 (TWO) TIMES DAILY WITH A MEAL. 180 tablet 1  . omeprazole (PRILOSEC) 40 MG capsule Take 1 capsule (40 mg total) by mouth daily. 90 capsule 3  . Potassium Citrate 15 MEQ (1620 MG) TBCR Take 1 tablet by mouth 2 (two) times daily. 180 tablet 3  . sildenafil (REVATIO) 20 MG tablet TAKE 2-5 TABLETS BY MOUTH 1 HOUR BEFORE INTERCOURSE.  11   No facility-administered medications prior to visit.     Allergies  Allergen Reactions  . Amoxicillin Rash  . Ciprofloxacin Hcl Rash    ROS Review of Systems  Constitutional: Negative for appetite change, chills, fatigue and fever.  HENT: Positive for ear pain (see hpi). Negative for congestion, dental problem and sore throat.   Eyes: Negative for discharge, redness and visual disturbance.  Respiratory: Negative for cough, chest tightness, shortness of breath and wheezing.   Cardiovascular: Negative for chest pain, palpitations and leg swelling.  Gastrointestinal: Negative for abdominal pain, blood in stool, diarrhea, nausea and vomiting.  Endocrine: Negative for cold intolerance, heat intolerance, polydipsia, polyphagia and polyuria.  Genitourinary: Negative for difficulty urinating, dysuria, flank pain, frequency, hematuria and urgency.  Musculoskeletal: Negative for arthralgias, back pain, joint swelling, myalgias and neck stiffness.  Skin: Negative for pallor and rash.  Neurological: Negative for  dizziness, speech difficulty, weakness and headaches.  Hematological: Negative for adenopathy. Does not bruise/bleed easily.  Psychiatric/Behavioral: Negative for confusion and sleep disturbance. The patient is not nervous/anxious.     PE; Initial bp today 143/87 with automatic cuff.  Repeat at end of visit with manual cuff was 140/80. Blood pressure (!) 143/87, pulse 72, temperature 98.4 F (36.9 C), temperature source Temporal, resp. rate 16, height 6' 5.5" (1.969 m), weight 266 lb  3.2 oz (120.7 kg), SpO2 97 %. Body mass index is 31.16 kg/m.  Gen: Alert, well appearing.  Patient is oriented to person, place, time, and situation. AFFECT: pleasant, lucid thought and speech. ENT: Ears: EACs both mildly edematous, with moist cerumen/exudate diffusely adherent to walls of canal.  TMs poorly visualized but portions visualized were normal.  Minimal tenderness with palpation of external auditory meatus region bilat.  Small lymph node palpable just beneath angle of R mandible.   Eyes: no injection, icteris, swelling, or exudate.  EOMI, PERRLA. Nose: no drainage or turbinate edema/swelling.  No injection or focal lesion.  Mouth: lips without lesion/swelling.  Oral mucosa pink and moist.  Dentition intact and without obvious caries or gingival swelling.  Oropharynx without erythema, exudate, or swelling.  Neck: supple/nontender.  No LAD, mass, or TM.  Carotid pulses 2+ bilaterally, without bruits. CV: RRR, no m/r/g.   LUNGS: CTA bilat, nonlabored resps, good aeration in all lung fields. ABD: soft, NT, ND, BS normal.  No hepatospenomegaly or mass.  No bruits. EXT: no clubbing, cyanosis, or edema.  Musculoskeletal: no joint swelling, erythema, warmth, or tenderness.  ROM of all joints intact. Skin - no sores or suspicious lesions or rashes or color changes   Pertinent labs:  Lab Results  Component Value Date   TSH 4.07 12/06/2017   Lab Results  Component Value Date   WBC 4.9 12/06/2017   HGB 16.8  12/06/2017   HCT 48.4 12/06/2017   MCV 87.6 12/06/2017   PLT 268.0 12/06/2017   Lab Results  Component Value Date   CREATININE 0.94 09/13/2018   BUN 20 09/13/2018   NA 141 09/13/2018   K 3.6 09/13/2018   CL 102 09/13/2018   CO2 31 09/13/2018   Lab Results  Component Value Date   ALT 34 09/13/2018   AST 25 09/13/2018   ALKPHOS 64 09/13/2018   BILITOT 0.9 09/13/2018   Lab Results  Component Value Date   CHOL 102 09/13/2018   Lab Results  Component Value Date   HDL 44.10 09/13/2018   Lab Results  Component Value Date   LDLCALC 45 09/13/2018   Lab Results  Component Value Date   TRIG 66.0 09/13/2018   Lab Results  Component Value Date   CHOLHDL 2 09/13/2018   Lab Results  Component Value Date   PSA 1.06 12/06/2017   Lab Results  Component Value Date   HGBA1C 6.8 (A) 12/08/2018    ASSESSMENT AND PLAN:   1) DM 2:  Hba1c today. Starting to get some sx's of DPN.  Discussed precautions about this again today. Lytes/cr today. No med changes at this time.  2) Acute otitis externa -bilat, mild. Cortisporin otic 4 gtts tid each ear for 7d.  3) Elev bp w/out dx HTN: He likely has developed HTN, but I want him to get more readings outside of the office. He has bp cuff at his dad's, who he visits daily.  He'll check bp and hr there daily and we'll review his numbers at telemed f/u in 2 wks.  No med started at this time but if I end up starting one it will be an ACE or ARB.  4) Health maintenance exam: Reviewed age and gender appropriate health maintenance issues (prudent diet, regular exercise, health risks of tobacco and excessive alcohol, use of seatbelts, fire alarms in home, use of sunscreen).  Also reviewed age and gender appropriate health screening as well as vaccine recommendations. Vaccines: Flu vaccine->given today.  All other recommended vaccines "UTD", including shingrix. Labs: fasting HP, Hba1c. Prostate ca screening: Urologist did his screening DRE  and PSA 11/2017-->pt will f/u with urol 09/2019 and will ask them to do prostate ca screening at that time. Colon ca screening: next colonoscopy due 2024.  An After Visit Summary was printed and given to the patient.  FOLLOW UP:  Return in about 2 weeks (around 09/07/2019) for f/u elev bp (telemedicine).  Signed:  Santiago Bumpers, MD           08/24/2019

## 2019-08-24 NOTE — Patient Instructions (Signed)

## 2019-08-25 LAB — COMPREHENSIVE METABOLIC PANEL
ALT: 25 U/L (ref 0–53)
AST: 17 U/L (ref 0–37)
Albumin: 4.7 g/dL (ref 3.5–5.2)
Alkaline Phosphatase: 79 U/L (ref 39–117)
BUN: 23 mg/dL (ref 6–23)
CO2: 30 mEq/L (ref 19–32)
Calcium: 9.9 mg/dL (ref 8.4–10.5)
Chloride: 101 mEq/L (ref 96–112)
Creatinine, Ser: 0.95 mg/dL (ref 0.40–1.50)
GFR: 83.2 mL/min (ref >60.00)
Glucose, Bld: 95 mg/dL (ref 70–99)
Potassium: 4 mEq/L (ref 3.5–5.1)
Sodium: 140 mEq/L (ref 135–145)
Total Bilirubin: 1 mg/dL (ref 0.2–1.2)
Total Protein: 7 g/dL (ref 6.0–8.3)

## 2019-08-25 LAB — LIPID PANEL
Cholesterol: 101 mg/dL (ref 0–200)
HDL: 40.1 mg/dL (ref >39.00)
LDL Cholesterol: 49 mg/dL (ref 0–99)
NonHDL: 61.11
Total CHOL/HDL Ratio: 3
Triglycerides: 59 mg/dL (ref 0.0–149.0)
VLDL: 11.8 mg/dL (ref 0.0–40.0)

## 2019-08-25 LAB — CBC WITH DIFFERENTIAL/PLATELET
Basophils Absolute: 0.1 10*3/uL (ref 0.0–0.1)
Basophils Relative: 1.2 % (ref 0.0–3.0)
Eosinophils Absolute: 0.3 10*3/uL (ref 0.0–0.7)
Eosinophils Relative: 3.8 % (ref 0.0–5.0)
HCT: 41.6 % (ref 39.0–52.0)
Hemoglobin: 14.2 g/dL (ref 13.0–17.0)
Lymphocytes Relative: 24.7 % (ref 12.0–46.0)
Lymphs Abs: 1.6 10*3/uL (ref 0.7–4.0)
MCHC: 34.1 g/dL (ref 30.0–36.0)
MCV: 89.4 fl (ref 78.0–100.0)
Monocytes Absolute: 0.4 10*3/uL (ref 0.1–1.0)
Monocytes Relative: 6.2 % (ref 3.0–12.0)
Neutro Abs: 4.3 10*3/uL (ref 1.4–7.7)
Neutrophils Relative %: 64.1 % (ref 43.0–77.0)
Platelets: 278 10*3/uL (ref 150.0–400.0)
RBC: 4.65 Mil/uL (ref 4.22–5.81)
RDW: 13.2 % (ref 11.5–15.5)
WBC: 6.7 10*3/uL (ref 4.0–10.5)

## 2019-08-25 LAB — HEMOGLOBIN A1C: Hgb A1c MFr Bld: 7.1 % — ABNORMAL HIGH (ref 4.6–6.5)

## 2019-08-25 LAB — TSH: TSH: 2.87 u[IU]/mL (ref 0.35–4.50)

## 2019-08-25 LAB — PSA: PSA: 0.98 ng/mL (ref 0.10–4.00)

## 2019-09-07 ENCOUNTER — Ambulatory Visit (INDEPENDENT_AMBULATORY_CARE_PROVIDER_SITE_OTHER): Payer: BC Managed Care – PPO | Admitting: Family Medicine

## 2019-09-07 ENCOUNTER — Other Ambulatory Visit: Payer: Self-pay

## 2019-09-07 ENCOUNTER — Encounter: Payer: Self-pay | Admitting: Family Medicine

## 2019-09-07 VITALS — BP 125/73 | HR 76

## 2019-09-07 DIAGNOSIS — E119 Type 2 diabetes mellitus without complications: Secondary | ICD-10-CM | POA: Diagnosis not present

## 2019-09-07 DIAGNOSIS — R030 Elevated blood-pressure reading, without diagnosis of hypertension: Secondary | ICD-10-CM | POA: Diagnosis not present

## 2019-09-07 NOTE — Progress Notes (Signed)
Virtual Visit via Video Note  I connected with pt on 09/07/19 at  3:30 PM EDT by a video enabled telemedicine application and verified that I am speaking with the correct person using two identifiers.  Location patient: home Location provider:work or home office Persons participating in the virtual visit: patient, provider  I discussed the limitations of evaluation and management by telemedicine and the availability of in person appointments. The patient expressed understanding and agreed to proceed.  Telemedicine visit is a necessity given the COVID-19 restrictions in place at the current time.  HPI: 53 y/o WM being seen today for 2 week f/u elev bp w/out dx of HTN and DM 2. A/P for this problem as of last visit-> "Elev bp w/out dx HTN: He likely has developed HTN, but I want him to get more readings outside of the office. He has bp cuff at his dad's, who he visits daily.  He'll check bp and hr there daily and we'll review his numbers at telemed f/u in 2 wks.  No med started at this time but if I end up starting one it will be an ACE or ARB."  Interim hx: Feeling well. Home bp's consistently 120s/70s.  DM: last a1c on 08/24/19 was 7.1% and I recommended adding low dose actos to his metformin. At this time he prefers to work more on his diet and exercise to try to control glucoses better. No home glucose monitoring has been set up.  Has never had any sx's of hypo or hyperglycemia.   ROS: See pertinent positives and negatives per HPI.  Past Medical History:  Diagnosis Date  . Atypical chest pain 2016   ETT 03/2015 NORMAL  . 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  . 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  No mass effect or enhancing lesions as of 2019 MRI.  Marland Kitchen Constipation   . Diabetes  mellitus without complication (HCC) 10/2017   [facility]'d [facility] CPE  . ED (erectile dysfunction)    Sildenafil trial by urol 05/2018.  . Gallstones    asymptomatic  . GERD (gastroesophageal reflux disease)    Dr. Dulce Sellar  . Heart murmur    Found at age 62, w/u unrevealing.  No cardiac symptoms.  . Kidney stones    [facility]: takes indapamide 1.25mg  qd + K cit bid.   Metabolic stone dz: renal leak hypercalcuria.  . Obesity, Class I, BMI 30-34.9   . OSA (obstructive sleep apnea) approx 2008   was put on CPAP but says he fell out of the habit of using it due to "laziness".  Has CPAP machine but doesn't use it.  . Osteoarthritis, multiple sites    Hands and right foot and both knees.  Ortho started meloxicam 08/2018.  Marland Kitchen Primary osteoarthritis of both knees    Vsco supplementation helpful in ths past.  . Prostate cancer screening    Urol did DRE (wnl) and PSA 05/24/18.    Past Surgical History:  Procedure Laterality Date  . ANTERIOR CRUCIATE LIGAMENT REPAIR  1991   ACL and PCL left knee  . COLONOSCOPY  02/01/2018   Tubular adenoma, repeat 5 years   . ETT  03/2015   NORMAL    Family History  Problem Relation Age of Onset  . Osteoarthritis Mother   . Arthritis Mother   .  Heart failure Father   . Leukemia Maternal Grandfather   . Colon cancer Neg Hx   . Prostate cancer Neg Hx     SOCIAL HX:  Social History   Socioeconomic History  . Marital status: Married    Spouse name: Not on file  . Number of children: Not on file  . Years of education: Not on file  . Highest education level: Not on file  Occupational History  . Not on file  Social Needs  . Financial resource strain: Not on file  . Food insecurity    Worry: Not on file    Inability: Not on file  . Transportation needs    Medical: Not on file    Non-medical: Not on file  Tobacco Use  . Smoking status: Never Smoker  . Smokeless tobacco: Never Used  Substance and Sexual Activity  . Alcohol use: No    Alcohol/week:  0.0 standard drinks  . Drug use: No  . Sexual activity: Not on file  Lifestyle  . Physical activity    Days per week: Not on file    Minutes per session: Not on file  . Stress: Not on file  Relationships  . Social Herbalist on phone: Not on file    Gets together: Not on file    Attends religious service: Not on file    Active member of club or organization: Not on file    Attends meetings of clubs or organizations: Not on file    Relationship status: Not on file  Other Topics Concern  . Not on file  Social History Narrative   Married, no children.   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   No T/A/Ds.      Current Outpatient Medications:  .  atorvastatin (LIPITOR) 20 MG tablet, TAKE 1 TABLET BY MOUTH EVERY DAY, Disp: 90 tablet, Rfl: 0 .  indapamide (LOZOL) 1.25 MG tablet, Take 1.25 mg by mouth every morning., Disp: , Rfl: 6 .  meloxicam (MOBIC) 7.5 MG tablet, Take 1 tablet (7.5 mg total) by mouth 2 (two) times daily., Disp: 180 tablet, Rfl: 1 .  metFORMIN (GLUCOPHAGE) 1000 MG tablet, TAKE 1 TABLET (1,000 MG TOTAL) BY MOUTH 2 (TWO) TIMES DAILY WITH A MEAL., Disp: 180 tablet, Rfl: 1 .  omeprazole (PRILOSEC) 40 MG capsule, Take 1 capsule (40 mg total) by mouth daily., Disp: 90 capsule, Rfl: 3 .  Potassium Citrate 15 MEQ (1620 MG) TBCR, Take 1 tablet by mouth 2 (two) times daily., Disp: 180 tablet, Rfl: 3 .  sildenafil (REVATIO) 20 MG tablet, TAKE 2-5 TABLETS BY MOUTH 1 HOUR BEFORE INTERCOURSE., Disp: , Rfl: 11  EXAM:  VITALS per patient if applicable: BP 161/09 (BP Location: Left Arm, Patient Position: Sitting, Cuff Size: Large)   Pulse 76    GENERAL: alert, oriented, appears well and in no acute distress  HEENT: atraumatic, conjunttiva clear, no obvious abnormalities on inspection of external nose and ears  NECK: normal movements of the head and neck  LUNGS: on inspection no signs of respiratory distress, breathing rate appears  normal, no obvious gross SOB, gasping or wheezing  CV: no obvious cyanosis  MS: moves all visible extremities without noticeable abnormality  PSYCH/NEURO: pleasant and cooperative, no obvious depression or anxiety, speech and thought processing grossly intact  LABS: none today    Chemistry      Component Value Date/Time   NA 140 08/24/2019 1549  K 4.0 08/24/2019 1549   CL 101 08/24/2019 1549   CO2 30 08/24/2019 1549   BUN 23 08/24/2019 1549   CREATININE 0.95 08/24/2019 1549      Component Value Date/Time   CALCIUM 9.9 08/24/2019 1549   ALKPHOS 79 08/24/2019 1549   AST 17 08/24/2019 1549   ALT 25 08/24/2019 1549   BILITOT 1.0 08/24/2019 1549     Lab Results  Component Value Date   HGBA1C 7.1 (H) 08/24/2019    ASSESSMENT AND PLAN:  Discussed the following assessment and plan:  1) Elev bp w/out dx of HTN/white coat syndrome: home bp's excellent.  2) DM 2, control not ideal. A1c goal is <7%. He prefers to work on [employer] rather than add actos at this time. Continue metformin 1000 mg bid.   The patient was advised to call back or seek an in-person evaluation if the symptoms worsen or if the condition fails to improve as anticipated.  F/u: 3 mo RCI  Signed:  Santiago BumpersPhil Mykale Gandolfo, MD           09/07/2019

## 2019-10-13 ENCOUNTER — Other Ambulatory Visit: Payer: Self-pay | Admitting: Family Medicine

## 2019-10-24 ENCOUNTER — Other Ambulatory Visit: Payer: Self-pay

## 2019-10-24 MED ORDER — INDAPAMIDE 1.25 MG PO TABS: 1.2500 mg | ORAL_TABLET | Freq: Every morning | ORAL | 1 refills | Status: DC

## 2019-10-24 MED ORDER — POTASSIUM CITRATE ER 15 MEQ (1620 MG) PO TBCR: 1.0000 | EXTENDED_RELEASE_TABLET | Freq: Two times a day (BID) | ORAL | 3 refills | Status: DC

## 2019-10-24 MED ORDER — OMEPRAZOLE 40 MG PO CPDR: 40.0000 mg | DELAYED_RELEASE_CAPSULE | Freq: Every day | ORAL | 3 refills | Status: DC

## 2019-10-24 MED ORDER — ATORVASTATIN CALCIUM 20 MG PO TABS: 20.0000 mg | ORAL_TABLET | Freq: Every day | ORAL | 0 refills | Status: DC

## 2019-10-24 MED ORDER — MELOXICAM 7.5 MG PO TABS: 7.5000 mg | ORAL_TABLET | Freq: Two times a day (BID) | ORAL | 1 refills | Status: DC

## 2019-11-13 ENCOUNTER — Other Ambulatory Visit: Payer: Self-pay | Admitting: Family Medicine

## 2019-11-24 DIAGNOSIS — N200 Calculus of kidney: Secondary | ICD-10-CM | POA: Diagnosis not present

## 2019-11-24 DIAGNOSIS — Z125 Encounter for screening for malignant neoplasm of prostate: Secondary | ICD-10-CM | POA: Diagnosis not present

## 2019-11-24 LAB — PSA: PSA: 0.89

## 2019-12-01 DIAGNOSIS — Q6102 Congenital multiple renal cysts: Secondary | ICD-10-CM | POA: Diagnosis not present

## 2019-12-01 DIAGNOSIS — N200 Calculus of kidney: Secondary | ICD-10-CM | POA: Diagnosis not present

## 2019-12-01 DIAGNOSIS — N5201 Erectile dysfunction due to arterial insufficiency: Secondary | ICD-10-CM | POA: Diagnosis not present

## 2019-12-04 DIAGNOSIS — H52223 Regular astigmatism, bilateral: Secondary | ICD-10-CM | POA: Diagnosis not present

## 2019-12-04 DIAGNOSIS — H524 Presbyopia: Secondary | ICD-10-CM | POA: Diagnosis not present

## 2019-12-04 DIAGNOSIS — E119 Type 2 diabetes mellitus without complications: Secondary | ICD-10-CM | POA: Diagnosis not present

## 2019-12-04 DIAGNOSIS — D3131 Benign neoplasm of right choroid: Secondary | ICD-10-CM | POA: Diagnosis not present

## 2019-12-04 DIAGNOSIS — H5213 Myopia, bilateral: Secondary | ICD-10-CM | POA: Diagnosis not present

## 2019-12-04 LAB — HM DIABETES EYE EXAM

## 2019-12-07 ENCOUNTER — Encounter: Payer: Self-pay | Admitting: Family Medicine

## 2019-12-08 ENCOUNTER — Ambulatory Visit: Payer: BC Managed Care – PPO | Admitting: Family Medicine

## 2019-12-11 ENCOUNTER — Ambulatory Visit (INDEPENDENT_AMBULATORY_CARE_PROVIDER_SITE_OTHER): Payer: BC Managed Care – PPO | Admitting: Family Medicine

## 2019-12-11 ENCOUNTER — Encounter: Payer: Self-pay | Admitting: Family Medicine

## 2019-12-11 ENCOUNTER — Other Ambulatory Visit: Payer: Self-pay

## 2019-12-11 VITALS — BP 138/78

## 2019-12-11 DIAGNOSIS — E119 Type 2 diabetes mellitus without complications: Secondary | ICD-10-CM | POA: Diagnosis not present

## 2019-12-11 NOTE — Progress Notes (Signed)
Virtual Visit via Video Note  I connected with Adrian Cook on 12/11/19 at  3:30 PM EST by a video enabled telemedicine application and verified that I am speaking with the correct person using two identifiers.  Location patient: home Location provider:work or home office Persons participating in the virtual visit: patient, provider  I discussed the limitations of evaluation and management by telemedicine and the availability of in person appointments. The patient expressed understanding and agreed to proceed.  Telemedicine visit is a necessity given the COVID-19 restrictions in place at the current time.  HPI: 54 y/o male being seen today for f/u DM 2. A/P as of last visit:  "DM 2, control not ideal. A1c goal is <7%. He prefers to work on [employer] rather than add actos at this time. Continue metformin 1000 mg bid."  Interim hx: Feeling well, no acute complaints or questions. Compliant with meds and diet.  He is active at work but no formal exercise regimen. No polyuria or polydipsia.  ROS: See pertinent positives and negatives per HPI.  Past Medical History:  Diagnosis Date  . Atypical chest pain 2016   ETT 03/2015 NORMAL  . 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  . 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  No mass effect or enhancing lesions as of 2019 MRI.  Marland Kitchen Constipation   . Diabetes mellitus without complication ([place]) 78/2956   Dx'd at [facility] CPE  . ED (erectile dysfunction)    Sildenafil trial by urol 05/2018.  . Gallstones    asymptomatic  . GERD (gastroesophageal reflux disease)    Dr. Paulita Fujita  . Heart murmur    Found at age 61, w/u unrevealing.  No cardiac symptoms.  . Kidney stones    [facility]: takes indapamide 1.25mg  qd + K cit bid.   Metabolic stone dz: renal leak  hypercalcuria.  . Obesity, Class I, BMI 30-34.9   . OSA (obstructive sleep apnea) approx 2008   was put on CPAP but says he fell out of the habit of using it due to "laziness".  Has CPAP machine but doesn't use it.  . Osteoarthritis, multiple sites    Hands and right foot and both knees.  Ortho started meloxicam 08/2018.  Marland Kitchen Primary osteoarthritis of both knees    Vsco supplementation helpful in ths past.  . Prostate cancer screening    Urol did DRE (wnl) and PSA 05/24/18.    Past Surgical History:  Procedure Laterality Date  . ANTERIOR CRUCIATE LIGAMENT REPAIR  1991   ACL and PCL left knee  . COLONOSCOPY  02/01/2018   Tubular adenoma, repeat 5 years   . ETT  03/2015   NORMAL    Family History  Problem Relation Age of Onset  . Osteoarthritis Mother   . Arthritis Mother   . Heart failure Father   . Leukemia Maternal Grandfather   . Colon cancer Neg Hx   . Prostate cancer Neg Hx     SOCIAL HX:  Social History   Socioeconomic History  . Marital status: Married    Spouse name: Not on file  . Number of children: Not on file  . Years of education: Not on file  . Highest education level: Not on file  Occupational History  . Not on file  Tobacco Use  . Smoking status: Never  Smoker  . Smokeless tobacco: Never Used  Substance and Sexual Activity  . Alcohol use: No    Alcohol/week: 0.0 standard drinks  . Drug use: No  . Sexual activity: Not on file  Other Topics Concern  . Not on file  Social History Narrative   Married, no children.   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   No T/A/Ds.   Social Determinants of Health   Financial Resource Strain:   . Difficulty of Paying Living Expenses: Not on file  Food Insecurity:   . Worried About Programme researcher, broadcasting/film/video in the Last Year: Not on file  . Ran Out of Food in the Last Year: Not on file  Transportation Needs:   . Lack of Transportation (Medical): Not on file  . Lack of Transportation  (Non-Medical): Not on file  Physical Activity:   . Days of Exercise per Week: Not on file  . Minutes of Exercise per Session: Not on file  Stress:   . Feeling of Stress : Not on file  Social Connections:   . Frequency of Communication with Friends and Family: Not on file  . Frequency of Social Gatherings with Friends and Family: Not on file  . Attends Religious Services: Not on file  . Active Member of Clubs or Organizations: Not on file  . Attends Banker Meetings: Not on file  . Marital Status: Not on file     Current Outpatient Medications:  .  atorvastatin (LIPITOR) 20 MG tablet, TAKE 1 TABLET BY MOUTH  DAILY, Disp: 90 tablet, Rfl: 3 .  indapamide (LOZOL) 1.25 MG tablet, Take 1 tablet (1.25 mg total) by mouth every morning., Disp: 90 tablet, Rfl: 1 .  meloxicam (MOBIC) 7.5 MG tablet, Take 1 tablet (7.5 mg total) by mouth 2 (two) times daily., Disp: 180 tablet, Rfl: 1 .  metFORMIN (GLUCOPHAGE) 1000 MG tablet, TAKE 1 TABLET (1,000 MG TOTAL) BY MOUTH 2 (TWO) TIMES DAILY WITH A MEAL., Disp: 180 tablet, Rfl: 0 .  omeprazole (PRILOSEC) 40 MG capsule, Take 1 capsule (40 mg total) by mouth daily., Disp: 90 capsule, Rfl: 3 .  Potassium Citrate 15 MEQ (1620 MG) TBCR, Take 1 tablet by mouth 2 (two) times daily., Disp: 180 tablet, Rfl: 3  EXAM:  VITALS per patient if applicable: BP 138/78 (BP Location: Left Arm, Patient Position: Sitting, Cuff Size: Large)    GENERAL: alert, oriented, appears well and in no acute distress  HEENT: atraumatic, conjunttiva clear, no obvious abnormalities on inspection of external nose and ears  NECK: normal movements of the head and neck  LUNGS: on inspection no signs of respiratory distress, breathing rate appears normal, no obvious gross SOB, gasping or wheezing  CV: no obvious cyanosis  MS: moves all visible extremities without noticeable abnormality  PSYCH/NEURO: pleasant and cooperative, no obvious depression or anxiety, speech and  thought processing grossly intact  LABS: none today    Chemistry      Component Value Date/Time   NA 140 08/24/2019 1549   K 4.0 08/24/2019 1549   CL 101 08/24/2019 1549   CO2 30 08/24/2019 1549   BUN 23 08/24/2019 1549   CREATININE 0.95 08/24/2019 1549      Component Value Date/Time   CALCIUM 9.9 08/24/2019 1549   ALKPHOS 79 08/24/2019 1549   AST 17 08/24/2019 1549   ALT 25 08/24/2019 1549   BILITOT 1.0 08/24/2019 1549     Lab Results  Component  Value Date   HGBA1C 7.1 (H) 08/24/2019   Lab Results  Component Value Date   CHOL 101 08/24/2019   HDL 40.10 08/24/2019   LDLCALC 49 08/24/2019   TRIG 59.0 08/24/2019   CHOLHDL 3 08/24/2019   Lab Results  Component Value Date   WBC 6.7 08/24/2019   HGB 14.2 08/24/2019   HCT 41.6 08/24/2019   MCV 89.4 08/24/2019   PLT 278.0 08/24/2019   Lab Results  Component Value Date   TSH 2.87 08/24/2019    ASSESSMENT AND PLAN:  Discussed the following assessment and plan:  1) DM; no home glucose monitoring at this time. Doing well on diet and metformin.  Needs to do some CV exercise. He'll make appt for fasting BMET and A1c at his earliest convenience.   I discussed the assessment and treatment plan with the patient. The patient was provided an opportunity to ask questions and all were answered. The patient agreed with the plan and demonstrated an understanding of the instructions.   The patient was advised to call back or seek an in-person evaluation if the symptoms worsen or if the condition fails to improve as anticipated.  F/u: 3-4 mo  Signed:  Santiago Bumpers, MD           12/11/2019

## 2019-12-13 ENCOUNTER — Encounter: Payer: Self-pay | Admitting: Family Medicine

## 2019-12-14 ENCOUNTER — Encounter: Payer: Self-pay | Admitting: Family Medicine

## 2020-01-10 ENCOUNTER — Other Ambulatory Visit: Payer: Self-pay | Admitting: Family Medicine

## 2020-02-05 ENCOUNTER — Other Ambulatory Visit: Payer: Self-pay

## 2020-02-05 MED ORDER — METFORMIN HCL 1000 MG PO TABS: 1000.0000 mg | ORAL_TABLET | Freq: Two times a day (BID) | ORAL | 0 refills | Status: DC

## 2020-02-15 DIAGNOSIS — M25512 Pain in left shoulder: Secondary | ICD-10-CM | POA: Insufficient documentation

## 2020-02-17 ENCOUNTER — Emergency Department (HOSPITAL_BASED_OUTPATIENT_CLINIC_OR_DEPARTMENT_OTHER)
Admission: EM | Admit: 2020-02-17 | Discharge: 2020-02-17 | Disposition: A | Discharge status: Home / Self Care | Payer: BC Managed Care – PPO | Attending: Emergency Medicine | Admitting: Emergency Medicine

## 2020-02-17 ENCOUNTER — Encounter (HOSPITAL_BASED_OUTPATIENT_CLINIC_OR_DEPARTMENT_OTHER): Payer: Self-pay | Admitting: Emergency Medicine

## 2020-02-17 ENCOUNTER — Emergency Department (HOSPITAL_BASED_OUTPATIENT_CLINIC_OR_DEPARTMENT_OTHER): Payer: BC Managed Care – PPO

## 2020-02-17 ENCOUNTER — Other Ambulatory Visit: Payer: Self-pay

## 2020-02-17 DIAGNOSIS — R7989 Other specified abnormal findings of blood chemistry: Secondary | ICD-10-CM | POA: Insufficient documentation

## 2020-02-17 DIAGNOSIS — K8020 Calculus of gallbladder without cholecystitis without obstruction: Secondary | ICD-10-CM | POA: Diagnosis not present

## 2020-02-17 DIAGNOSIS — Z7984 Long term (current) use of oral hypoglycemic drugs: Secondary | ICD-10-CM | POA: Insufficient documentation

## 2020-02-17 DIAGNOSIS — E119 Type 2 diabetes mellitus without complications: Secondary | ICD-10-CM | POA: Insufficient documentation

## 2020-02-17 DIAGNOSIS — N200 Calculus of kidney: Secondary | ICD-10-CM | POA: Diagnosis not present

## 2020-02-17 DIAGNOSIS — R109 Unspecified abdominal pain: Secondary | ICD-10-CM | POA: Diagnosis not present

## 2020-02-17 DIAGNOSIS — N132 Hydronephrosis with renal and ureteral calculous obstruction: Secondary | ICD-10-CM | POA: Diagnosis not present

## 2020-02-17 LAB — BASIC METABOLIC PANEL
Anion gap: 12 (ref 5–15)
BUN: 22 mg/dL — ABNORMAL HIGH (ref 6–20)
CO2: 27 mmol/L (ref 22–32)
Calcium: 9.3 mg/dL (ref 8.9–10.3)
Chloride: 101 mmol/L (ref 98–111)
Creatinine, Ser: 1.38 mg/dL — ABNORMAL HIGH (ref 0.61–1.24)
GFR calc Af Amer: >60 mL/min (ref >60)
GFR calc non Af Amer: 58 mL/min — ABNORMAL LOW (ref >60)
Glucose, Bld: 192 mg/dL — ABNORMAL HIGH (ref 70–99)
Potassium: 4.2 mmol/L (ref 3.5–5.1)
Sodium: 140 mmol/L (ref 135–145)

## 2020-02-17 LAB — URINALYSIS, ROUTINE W REFLEX MICROSCOPIC
Bilirubin Urine: NEGATIVE
Glucose, UA: 100 mg/dL — AB
Hgb urine dipstick: NEGATIVE
Ketones, ur: 15 mg/dL — AB
Leukocytes,Ua: NEGATIVE
Nitrite: NEGATIVE
Protein, ur: NEGATIVE mg/dL
Specific Gravity, Urine: >1.03 — ABNORMAL HIGH (ref 1.005–1.030)
pH: 6 (ref 5.0–8.0)

## 2020-02-17 MED ORDER — FENTANYL CITRATE (PF) 100 MCG/2ML IJ SOLN
50.0000 ug | Freq: Once | INTRAMUSCULAR | Status: AC
  Administered 2020-02-17: 50 ug via INTRAVENOUS
  Filled 2020-02-17: qty 2

## 2020-02-17 MED ORDER — KETOROLAC TROMETHAMINE 15 MG/ML IJ SOLN
15.0000 mg | Freq: Once | INTRAMUSCULAR | Status: AC
  Administered 2020-02-17: 15 mg via INTRAVENOUS
  Filled 2020-02-17: qty 1

## 2020-02-17 MED ORDER — SODIUM CHLORIDE 0.9 % IV BOLUS
1000.0000 mL | Freq: Once | INTRAVENOUS | Status: AC
  Administered 2020-02-17: 1000 mL via INTRAVENOUS

## 2020-02-17 MED ORDER — TAMSULOSIN HCL 0.4 MG PO CAPS: 0.4000 mg | ORAL_CAPSULE | Freq: Every day | ORAL | 0 refills | Status: DC

## 2020-02-17 MED ORDER — HYDROCODONE-ACETAMINOPHEN 5-325 MG PO TABS: 1.0000 | ORAL_TABLET | Freq: Four times a day (QID) | ORAL | 0 refills | Status: DC | PRN

## 2020-02-17 NOTE — ED Provider Notes (Signed)
[facility] EMERGENCY DEPARTMENT Provider Note   CSN: [id number] Arrival date & time: 02/17/20  7829     History Chief Complaint  Patient presents with  . Flank Pain    Adrian Cook is a 54 y.o. male.  Patient is a 54 year old male who presents with left flank pain.  He has a history of kidney stones and has had several in the past.  [facility].  He said the pain started about 2 AM this morning and feels just like his prior kidney stones.  It starts in his left mid back and radiates to his left mid abdomen.  He denies any hematuria.  He has had a little bit of difficulty getting his urine out.  He has had some nausea but no vomiting.  No known fevers.  He has not taken anything at home for the pain.        Past Medical History:  Diagnosis Date  . Atypical chest pain 2016   ETT 03/2015 NORMAL  . 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  . 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  No mass effect or enhancing lesions as of 2019 MRI.  Marland Kitchen Constipation   . Diabetes mellitus without complication ([facility]) 10/2017   [facility]x'd [facility] CPE  . ED (erectile dysfunction)    Sildenafil trial by urol 05/2018.  . Gallstones    asymptomatic  . GERD (gastroesophageal reflux disease)    Dr. Dulce Sellar  . Heart murmur    Found at age 46, w/u unrevealing.  No cardiac symptoms.  . Kidney stones    [facility]: takes indapamide 1.25mg  qd + K cit bid.   Metabolic stone dz: renal leak hypercalcuria.  . Obesity, Class I, BMI 30-34.9   . OSA (obstructive sleep apnea) approx 2008   was put on CPAP but says he fell out of the habit of using it due to "laziness".  Has CPAP machine but doesn't use it.  . Osteoarthritis, multiple sites    Hands and right foot and both knees.  Ortho  started meloxicam 08/2018.  Marland Kitchen Primary osteoarthritis of both knees    Vsco supplementation helpful in ths past.  . Prostate cancer screening    Urol did DRE (wnl) and PSA 05/24/18. PSA 0.89 Nov 24, 2019 at [facility]    Patient Active Problem List   Diagnosis Date Noted  . Osteoarthritis of right knee 07/26/2018  . Osteoarthritis of left knee 07/26/2018  . Acute upper respiratory infection 03/19/2015  . LUMBOSACRAL STRAIN 03/31/2009    Past Surgical History:  Procedure Laterality Date  . ANTERIOR CRUCIATE LIGAMENT REPAIR  1991   ACL and PCL left knee  . COLONOSCOPY  02/01/2018   Tubular adenoma, repeat 5 years   . ETT  03/2015   NORMAL       Family History  Problem Relation Age of Onset  . Osteoarthritis Mother   . Arthritis Mother   . Heart failure Father   . Leukemia Maternal Grandfather   . Colon cancer Neg Hx   . Prostate cancer Neg Hx     Social History   Tobacco Use  . Smoking status: Never Smoker  . Smokeless tobacco: Never Used  Substance Use Topics  . Alcohol use: No    Alcohol/week: 0.0 standard  drinks  . Drug use: No    Home Medications Prior to Admission medications   Medication Sig Start Date End Date Taking? Authorizing Provider  atorvastatin (LIPITOR) 20 MG tablet TAKE 1 TABLET BY MOUTH  DAILY 11/14/19   McGowen, Adrian Blackwater, MD  HYDROcodone-acetaminophen (NORCO/VICODIN) 5-325 MG tablet Take 1-2 tablets by mouth every 6 (six) hours as needed. 02/17/20   Malvin Johns, MD  indapamide (LOZOL) 1.25 MG tablet Take 1 tablet (1.25 mg total) by mouth every morning. 10/24/19   McGowen, Adrian Blackwater, MD  meloxicam (MOBIC) 7.5 MG tablet Take 1 tablet (7.5 mg total) by mouth 2 (two) times daily. 10/24/19   McGowen, Adrian Blackwater, MD  metFORMIN (GLUCOPHAGE) 1000 MG tablet Take 1 tablet (1,000 mg total) by mouth 2 (two) times daily with a meal. 02/05/20   McGowen, Adrian Blackwater, MD  omeprazole (PRILOSEC) 40 MG capsule Take 1 capsule (40 mg total) by mouth daily. 10/24/19   McGowen, Adrian Blackwater, MD  Potassium Citrate 15 MEQ (1620 MG) TBCR Take 1 tablet by mouth 2 (two) times daily. 10/24/19   McGowen, Adrian Blackwater, MD  tamsulosin (FLOMAX) 0.4 MG CAPS capsule Take 1 capsule (0.4 mg total) by mouth daily. 02/17/20   Malvin Johns, MD    Allergies    Amoxicillin and Ciprofloxacin hcl  Review of Systems   Review of Systems  Constitutional: Negative for chills, diaphoresis, fatigue and fever.  HENT: Negative for congestion, rhinorrhea and sneezing.   Eyes: Negative.   Respiratory: Negative for cough, chest tightness and shortness of breath.   Cardiovascular: Negative for chest pain and leg swelling.  Gastrointestinal: Positive for abdominal pain. Negative for blood in stool, diarrhea, nausea and vomiting.  Genitourinary: Positive for difficulty urinating and flank pain. Negative for frequency and hematuria.  Musculoskeletal: Positive for back pain. Negative for arthralgias.  Skin: Negative for rash.  Neurological: Negative for dizziness, speech difficulty, weakness, numbness and headaches.    Physical Exam Updated Vital Signs BP 128/88 (BP Location: Left Arm)   Pulse (!) 54   Temp 97.7 F (36.5 C) (Oral)   Resp 18   Ht 6\' 6"  (1.981 m)   Wt 120.2 kg   SpO2 95%   BMI 30.62 kg/m   Physical Exam Constitutional:      Appearance: He is well-developed.  HENT:     Head: Normocephalic and atraumatic.  Eyes:     Pupils: Pupils are equal, round, and reactive to light.  Cardiovascular:     Rate and Rhythm: Normal rate and regular rhythm.     Heart sounds: Normal heart sounds.  Pulmonary:     Effort: Pulmonary effort is normal. No respiratory distress.     Breath sounds: Normal breath sounds. No wheezing or rales.  Chest:     Chest wall: No tenderness.  Abdominal:     General: Bowel sounds are normal.     Palpations: Abdomen is soft.     Tenderness: There is abdominal tenderness (Positive tenderness to the left mid abdomen and flank). There is no guarding or rebound.    Musculoskeletal:        General: Normal range of motion.     Cervical back: Normal range of motion and neck supple.  Lymphadenopathy:     Cervical: No cervical adenopathy.  Skin:    General: Skin is warm and dry.     Findings: No rash.  Neurological:     Mental Status: He is alert and oriented to person, place, and time.  ED Results / Procedures / Treatments   Labs (all labs ordered are listed, but only abnormal results are displayed) Labs Reviewed  URINALYSIS, ROUTINE W REFLEX MICROSCOPIC - Abnormal; Notable for the following components:      Result Value   Specific Gravity, Urine >1.030 (*)    Glucose, UA 100 (*)    Ketones, ur 15 (*)    All other components within normal limits  BASIC METABOLIC PANEL - Abnormal; Notable for the following components:   Glucose, Bld 192 (*)    BUN 22 (*)    Creatinine, Ser 1.38 (*)    GFR calc non Af Amer 58 (*)    All other components within normal limits    EKG None  Radiology DG Abdomen 1 View  Result Date: 02/17/2020 CLINICAL DATA:  Flank pain EXAM: ABDOMEN - 1 VIEW COMPARISON:  December 01, 2019 FINDINGS: No abnormal calcifications are appreciable. Note that considerable stool overlies the kidneys. There is fairly diffuse stool throughout the colon. No bowel dilatation or air-fluid level to suggest bowel obstruction. No evident free air. IMPRESSION: No abnormal calcifications are appreciable. Note that significant stool overlies the kidneys which could mask small calculi in this area. No bowel obstruction or free air evident. Electronically Signed   By: Bretta Bang III M.D.   On: 02/17/2020 08:01   CT Renal Stone Study  Result Date: 02/17/2020 CLINICAL DATA:  Acute onset flank pain this morning. History of renal stone. EXAM: CT ABDOMEN AND PELVIS WITHOUT CONTRAST TECHNIQUE: Multidetector CT imaging of the abdomen and pelvis was performed following the standard protocol without IV contrast. COMPARISON:  CT abdomen dated  11/12/2015. FINDINGS: Lower chest: No acute abnormality. Hepatobiliary: Numerous small/tiny hypodense foci scattered within both liver lobes, too small to definitively characterize but statistically most likely benign cysts. No suspicious finding within the liver. Multiple small layering stones within the otherwise normal-appearing gallbladder. No bile duct dilatation appreciated. Pancreas: Unremarkable. No pancreatic ductal dilatation or surrounding inflammatory changes. Spleen: Normal in size without focal abnormality. Adrenals/Urinary Tract: Adrenal glands appear normal. 4 mm stone within the proximal LEFT ureter causing moderate LEFT-sided hydronephrosis and perinephric edema. Additional 1 mm in 4 mm LEFT renal stones. At least 2 punctate RIGHT renal stones. No RIGHT-sided ureteral stone. No bladder stone. Bladder is decompressed. Stomach/Bowel: No dilated large or small bowel loops. No evidence of bowel wall inflammation. Appendix is normal. Stomach is unremarkable, partially decompressed. Vascular/Lymphatic: No significant vascular findings are present. No enlarged abdominal or pelvic lymph nodes. Reproductive: Prostate is unremarkable. Other: No free fluid or abscess collection. No free intraperitoneal air. Musculoskeletal: Degenerative spondylosis at the lumbosacral junction, mild to moderate in degree. No acute or suspicious osseous finding. IMPRESSION: 1. 4 mm stone within the proximal LEFT ureter causing moderate LEFT-sided hydronephrosis and perinephric edema. 2. Bilateral nephrolithiasis. 3. Cholelithiasis without evidence of acute cholecystitis. Electronically Signed   By: Bary Richard M.D.   On: 02/17/2020 08:46    Procedures Procedures (including critical care time)  Medications Ordered in ED Medications  fentaNYL (SUBLIMAZE) injection 50 mcg (50 mcg Intravenous Given 02/17/20 0726)  ketorolac (TORADOL) 15 MG/ML injection 15 mg (15 mg Intravenous Given 02/17/20 0726)  sodium chloride 0.9 %  bolus 1,000 mL (1,000 mLs Intravenous New Bag/Given 02/17/20 0901)    ED Course  I have reviewed the triage vital signs and the nursing notes.  Pertinent labs & imaging results that were available during my care of the patient were reviewed by me and considered  in my medical decision making (see chart for details).    MDM Rules/Calculators/A&P                      Patient is a 54 year old male who presents with left flank pain.  He has had prior kidney stones that feels similar.  Initially I did a KUB which did not show any definitive evidence of a stone.  He did have an elevation in his creatinine at 1.38.  His urinalysis was clear without evidence of infection or hematuria.  Given these results, CT was performed which showed a 4 mm proximal stone in the left ureter with some evidence of hydronephrosis and stranding of the kidney.  He was given IV pain medication in the ED and is feeling better after this.  I spoke with Dr. Lynden Ang with urology who feels that patient can be discharged home and given the opportunity to pass the stone.  Patient symptoms are well controlled.  He has no evidence of UTI.  He was discharged home in good condition after IV fluids.  He was encouraged to follow-up closely with urology and I did advise him that he will need to have his creatinine followed closely by either urology or his PCP.  Return precautions were given. Final Clinical Impression(s) / ED Diagnoses Final diagnoses:  Kidney stone  Elevated serum creatinine    Rx / DC Orders ED Discharge Orders         Ordered    tamsulosin (FLOMAX) 0.4 MG CAPS capsule  Daily     02/17/20 0938    HYDROcodone-acetaminophen (NORCO/VICODIN) 5-325 MG tablet  Every 6 hours PRN     02/17/20 1610           Rolan Bucco, MD 02/17/20 0940

## 2020-02-17 NOTE — ED Triage Notes (Signed)
Patent arrived via POV c/o flank pain x 5 hrs. Patient states pain in on left side radiating back to front. Patient states pain is 9/10. Patient endorses history of kidney stones stating "this feels like one". Patient is AO x 4, elevated BP, normal gait.

## 2020-02-17 NOTE — Discharge Instructions (Signed)
Follow-up with urologist as directed.  Your creatinine was mildly elevated and needs to be closely followed either by the urologist or your primary care physician.  Return to the emergency room if you have any worsening pain, fevers, difficulty urinating, ongoing vomiting or other worsening symptoms.

## 2020-02-17 NOTE — ED Notes (Signed)
ED Provider at bedside. 

## 2020-02-20 DIAGNOSIS — M25512 Pain in left shoulder: Secondary | ICD-10-CM | POA: Diagnosis not present

## 2020-02-20 DIAGNOSIS — M7542 Impingement syndrome of left shoulder: Secondary | ICD-10-CM | POA: Diagnosis not present

## 2020-03-08 ENCOUNTER — Other Ambulatory Visit: Payer: Self-pay | Admitting: Family Medicine

## 2020-03-17 ENCOUNTER — Other Ambulatory Visit: Payer: Self-pay | Admitting: Family Medicine

## 2020-03-18 ENCOUNTER — Other Ambulatory Visit: Payer: Self-pay

## 2020-03-18 MED ORDER — METFORMIN HCL 1000 MG PO TABS: 1000.0000 mg | ORAL_TABLET | Freq: Two times a day (BID) | ORAL | 0 refills | Status: DC

## 2020-04-09 ENCOUNTER — Other Ambulatory Visit: Payer: Self-pay | Admitting: Family Medicine

## 2020-04-09 NOTE — Telephone Encounter (Signed)
LMOM for pt to CB to schedule appointment he is over due.   Patient was to come back in 3-4 months from 12/11/19 appt.  Once scheduled I can send in a 90 day supply but no more refills until he is seen.

## 2020-04-09 NOTE — Telephone Encounter (Signed)
Scheduled appt for 6/10 please send prescription 90 d/s to mail order pharmacy.   metFORMIN (GLUCOPHAGE) 1000 MG tablet [[id number]  [facility] [place], [place]

## 2020-04-25 ENCOUNTER — Other Ambulatory Visit: Payer: Self-pay

## 2020-04-25 ENCOUNTER — Encounter: Payer: Self-pay | Admitting: Family Medicine

## 2020-04-25 ENCOUNTER — Ambulatory Visit: Payer: BC Managed Care – PPO | Admitting: Family Medicine

## 2020-04-25 VITALS — BP 122/76 | HR 69 | Temp 98.0°F | Resp 16 | Ht 77.5 in | Wt 266.8 lb

## 2020-04-25 DIAGNOSIS — E119 Type 2 diabetes mellitus without complications: Secondary | ICD-10-CM | POA: Diagnosis not present

## 2020-04-25 DIAGNOSIS — E7800 Pure hypercholesterolemia, unspecified: Secondary | ICD-10-CM | POA: Diagnosis not present

## 2020-04-25 NOTE — Progress Notes (Signed)
OFFICE VISIT  04/25/2020   CC:  Chief Complaint  Patient presents with  . Follow-up    RCI, pt is not fasting     HPI:    Patient is a 54 y.o.  male who presents for 6 mo f/u DM 2 and HLD. A/P as of last visit: "1) DM; no home glucose monitoring at this time. Doing well on diet and metformin.  Needs to do some CV exercise. He'll make appt for fasting BMET and A1c at his earliest convenience."  INTERIM HX: Feeling well. Compliant with metformin. Went to nutritionist classes and he tries to follow their carb recommendations, limiting fats.  Portion sizes a little big.  Rare fast food.  Trying to minimize desserts. Exercise: none but leads an active life. No home gluc monitoring.  No polyuria or polydipsa.  Home bps occ checked, 130/80 or so.  Tolerating statin well.  Kidney stone 02/2020, had to take 1 vicodin and took flomax and passed it quickly.  ROS: no fevers, no CP, no SOB, no wheezing, no cough, no dizziness, no HAs, no rashes, no melena/hematochezia.  No polyuria or polydipsia.  No myalgias or arthralgias.  No focal weakness, paresthesias, or tremors.  No acute vision or hearing abnormalities. No n/v/d or abd pain.  No palpitations.    Past Medical History:  Diagnosis Date  . Atypical chest pain 2016   ETT 03/2015 NORMAL  . 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  . 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  No mass effect or enhancing lesions as of 2019 MRI.  Marland Kitchen Constipation   . Diabetes mellitus without complication ([place]) 27/0350   Dx'd at [facility] CPE  . ED (erectile dysfunction)    Sildenafil trial by urol 05/2018.  . Gallstones    asymptomatic  . GERD (gastroesophageal reflux disease)    Dr. Paulita Fujita  . Heart murmur    Found at age 61, w/u unrevealing.  No cardiac  symptoms.  . Kidney stones    [facility]: takes indapamide 1.25mg  qd + K cit bid.   Metabolic stone dz: renal leak hypercalcuria.  . Obesity, Class I, BMI 30-34.9   . OSA (obstructive sleep apnea) approx 2008   was put on CPAP but says he fell out of the habit of using it due to "laziness".  Has CPAP machine but doesn't use it.  . Osteoarthritis, multiple sites    Hands and right foot and both knees.  Ortho started meloxicam 08/2018.  Marland Kitchen Primary osteoarthritis of both knees    Vsco supplementation helpful in ths past.  . Prostate cancer screening    Urol did DRE (wnl) and PSA 05/24/18. PSA 0.89 Nov 24, 2019 at [facility]    Past Surgical History:  Procedure Laterality Date  . ANTERIOR CRUCIATE LIGAMENT REPAIR  1991   ACL and PCL left knee  . COLONOSCOPY  02/01/2018   Tubular adenoma, repeat 5 years   . ETT  03/2015   NORMAL    Outpatient Medications Prior to Visit  Medication Sig Dispense Refill  . atorvastatin (LIPITOR) 20 MG tablet TAKE 1 TABLET BY MOUTH  DAILY 90 tablet 3  . indapamide (LOZOL) 1.25 MG tablet TAKE 1 TABLET BY MOUTH IN  THE MORNING 90 tablet 0  . meloxicam (MOBIC) 7.5 MG tablet TAKE 1 TABLET BY  MOUTH  TWICE DAILY 180 tablet 0  . metFORMIN (GLUCOPHAGE) 1000 MG tablet TAKE 1 TABLET BY MOUTH  TWICE DAILY WITH MEALS 180 tablet 0  . omeprazole (PRILOSEC) 40 MG capsule Take 1 capsule (40 mg total) by mouth daily. 90 capsule 3  . Potassium Citrate 15 MEQ (1620 MG) TBCR Take 1 tablet by mouth 2 (two) times daily. 180 tablet 3  . HYDROcodone-acetaminophen (NORCO/VICODIN) 5-325 MG tablet Take 1-2 tablets by mouth every 6 (six) hours as needed. (Patient not taking: Reported on 04/25/2020) 15 tablet 0  . tamsulosin (FLOMAX) 0.4 MG CAPS capsule Take 1 capsule (0.4 mg total) by mouth daily. (Patient not taking: Reported on 04/25/2020) 10 capsule 0   No facility-administered medications prior to visit.    Allergies  Allergen Reactions  . Amoxicillin Rash  . Ciprofloxacin Hcl Rash     ROS As per HPI  PE: Blood pressure 122/76, pulse 69, temperature 98 F (36.7 C), temperature source Temporal, resp. rate 16, height 6' 5.5" (1.969 m), weight 266 lb 12.8 oz (121 kg), SpO2 97 %. Gen: Alert, well appearing.  Patient is oriented to person, place, time, and situation. AFFECT: pleasant, lucid thought and speech. CV: RRR, no m/r/g.   LUNGS: CTA bilat, nonlabored resps, good aeration in all lung fields. EXT: no clubbing or cyanosis.  no edema.    LABS:  Lab Results  Component Value Date   TSH 2.87 08/24/2019   Lab Results  Component Value Date   WBC 6.7 08/24/2019   HGB 14.2 08/24/2019   HCT 41.6 08/24/2019   MCV 89.4 08/24/2019   PLT 278.0 08/24/2019   Lab Results  Component Value Date   CREATININE 1.38 (H) 02/17/2020   BUN 22 (H) 02/17/2020   NA 140 02/17/2020   K 4.2 02/17/2020   CL 101 02/17/2020   CO2 27 02/17/2020   Lab Results  Component Value Date   ALT 25 08/24/2019   AST 17 08/24/2019   ALKPHOS 79 08/24/2019   BILITOT 1.0 08/24/2019   Lab Results  Component Value Date   CHOL 101 08/24/2019   Lab Results  Component Value Date   HDL 40.10 08/24/2019   Lab Results  Component Value Date   LDLCALC 49 08/24/2019   Lab Results  Component Value Date   TRIG 59.0 08/24/2019   Lab Results  Component Value Date   CHOLHDL 3 08/24/2019   Lab Results  Component Value Date   PSA 0.89 11/24/2019   PSA 0.98 08/24/2019   PSA 1.06 12/06/2017   Lab Results  Component Value Date   HGBA1C 7.1 (H) 08/24/2019    IMPRESSION AND PLAN:  1) DM 2.  No home glucose monitoring. Diet is fair.  Exercise not so good.  Wt is stable. HbA1c, BMET, and urine microalbumin/cr today.  2) HLD: tolerating statin.  Not fasting today. LDL 49 eight mo ago, hepatic panel normal. Plan repeat at next CPE in 80mo.  An After Visit Summary was printed and given to the patient.  FOLLOW UP: Return in about 4 months (around 08/25/2020).  Signed:  Santiago Bumpers,  MD           04/25/2020

## 2020-04-26 LAB — BASIC METABOLIC PANEL
BUN: 27 mg/dL — ABNORMAL HIGH (ref 6–23)
CO2: 31 mEq/L (ref 19–32)
Calcium: 9.4 mg/dL (ref 8.4–10.5)
Chloride: 103 mEq/L (ref 96–112)
Creatinine, Ser: 1.02 mg/dL (ref 0.40–1.50)
GFR: 76.45 mL/min (ref >60.00)
Glucose, Bld: 121 mg/dL — ABNORMAL HIGH (ref 70–99)
Potassium: 3.7 mEq/L (ref 3.5–5.1)
Sodium: 137 mEq/L (ref 135–145)

## 2020-04-26 LAB — MICROALBUMIN / CREATININE URINE RATIO
Creatinine,U: 117.6 mg/dL
Microalb Creat Ratio: 2.1 mg/g (ref 0.0–30.0)
Microalb, Ur: 2.5 mg/dL — ABNORMAL HIGH (ref 0.0–1.9)

## 2020-04-29 LAB — HEMOGLOBIN A1C: Hgb A1c MFr Bld: 7.1 % — ABNORMAL HIGH (ref 4.6–6.5)

## 2020-04-30 ENCOUNTER — Other Ambulatory Visit: Payer: Self-pay

## 2020-04-30 MED ORDER — PIOGLITAZONE HCL 15 MG PO TABS: 15.0000 mg | ORAL_TABLET | Freq: Every day | ORAL | 0 refills | Status: DC

## 2020-05-11 DIAGNOSIS — Z20822 Contact with and (suspected) exposure to covid-19: Secondary | ICD-10-CM | POA: Diagnosis not present

## 2020-05-11 DIAGNOSIS — Z03818 Encounter for observation for suspected exposure to other biological agents ruled out: Secondary | ICD-10-CM | POA: Diagnosis not present

## 2020-05-28 ENCOUNTER — Other Ambulatory Visit: Payer: Self-pay | Admitting: Family Medicine

## 2020-05-29 NOTE — Telephone Encounter (Signed)
RF request for Meloxicam LOV:04/25/20 Next ov: 08/30/20 Last written:03/11/20(180,0)  Medication pending. Please advise if refill appropriate, thanks.

## 2020-06-04 ENCOUNTER — Other Ambulatory Visit: Payer: Self-pay | Admitting: Family Medicine

## 2020-06-05 ENCOUNTER — Other Ambulatory Visit: Payer: Self-pay

## 2020-06-05 MED ORDER — METFORMIN HCL 1000 MG PO TABS: 1000.0000 mg | ORAL_TABLET | Freq: Two times a day (BID) | ORAL | 0 refills | Status: DC

## 2020-06-20 ENCOUNTER — Other Ambulatory Visit: Payer: Self-pay | Admitting: Family Medicine

## 2020-07-03 ENCOUNTER — Other Ambulatory Visit: Payer: Self-pay | Admitting: Family Medicine

## 2020-07-28 ENCOUNTER — Other Ambulatory Visit: Payer: Self-pay | Admitting: Family Medicine

## 2020-07-29 ENCOUNTER — Other Ambulatory Visit: Payer: Self-pay

## 2020-08-24 DIAGNOSIS — Z20822 Contact with and (suspected) exposure to covid-19: Secondary | ICD-10-CM | POA: Diagnosis not present

## 2020-08-24 DIAGNOSIS — Z03818 Encounter for observation for suspected exposure to other biological agents ruled out: Secondary | ICD-10-CM | POA: Diagnosis not present

## 2020-08-30 ENCOUNTER — Ambulatory Visit: Payer: BC Managed Care – PPO | Admitting: Family Medicine

## 2020-08-30 ENCOUNTER — Encounter: Payer: Self-pay | Admitting: Family Medicine

## 2020-08-30 ENCOUNTER — Other Ambulatory Visit: Payer: Self-pay

## 2020-08-30 VITALS — BP 147/88 | HR 71 | Temp 97.6°F | Resp 16 | Ht 77.5 in | Wt 264.0 lb

## 2020-08-30 DIAGNOSIS — M8949 Other hypertrophic osteoarthropathy, multiple sites: Secondary | ICD-10-CM | POA: Diagnosis not present

## 2020-08-30 DIAGNOSIS — E119 Type 2 diabetes mellitus without complications: Secondary | ICD-10-CM | POA: Diagnosis not present

## 2020-08-30 DIAGNOSIS — E7800 Pure hypercholesterolemia, unspecified: Secondary | ICD-10-CM | POA: Diagnosis not present

## 2020-08-30 DIAGNOSIS — R030 Elevated blood-pressure reading, without diagnosis of hypertension: Secondary | ICD-10-CM

## 2020-08-30 DIAGNOSIS — M159 Polyosteoarthritis, unspecified: Secondary | ICD-10-CM

## 2020-08-30 MED ORDER — TAMSULOSIN HCL 0.4 MG PO CAPS: 0.4000 mg | ORAL_CAPSULE | Freq: Every day | ORAL | 1 refills | Status: DC

## 2020-08-30 NOTE — Progress Notes (Signed)
OFFICE VISIT  08/30/2020  CC:  Chief Complaint  Patient presents with   Follow-up    RCI, pt is fasting   HPI:    Patient is a 54 y.o. male who presents for 4 mo f/u DM 2 and HLD. A/P as of last visit: "1) DM 2.  No home glucose monitoring. Diet is fair.  Exercise not so good.  Wt is stable. HbA1c, BMET, and urine microalbumin/cr today.  2) HLD: tolerating statin.  Not fasting today. LDL 49 eight mo ago, hepatic panel normal. Plan repeat at next CPE in 85mo."  INTERIM HX: Feeling well. Trying to make some dietary improvements, lower carb, more water, less restaurant and fastfood. Trying to walk more for exercise.  Home bp monitoring showing about 130/70s avg.   DM: I added pioglitazone 15mg  qd after last a1c (7.1%). Urine microalb/cr neg and sCr and lytes all normal last check 4 mo ago. No probs from pioglit. No home gluc monitoring.  HLD: tolerating atorva 20mg  qd.  Chron bilat feet and knees pain, neck pain, occ hip. Takes meloxicam 7.5 mg tab every morning and this helps moderately.  ROS: no fevers, no CP, no SOB, no wheezing, no cough, no dizziness, no HAs, no rashes, no melena/hematochezia.  No polyuria or polydipsia.  No myalgias.  No focal weakness, paresthesias, or tremors.  No acute vision or hearing abnormalities. No n/v/d or abd pain.  No palpitations.    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  No mass effect or enhancing lesions as of 2019 MRI.   Constipation    Diabetes mellitus without complication ([facility]) 10/2017   Dx'd at [facility]   ED (erectile dysfunction)    Sildenafil trial by urol 05/2018.   Gallstones     asymptomatic   GERD (gastroesophageal reflux disease)    Dr. 11/2017   Heart murmur    Found at age 36, w/u unrevealing.  No cardiac symptoms.   Kidney stones    [facility]: takes indapamide 1.25mg  qd + K cit bid.   Metabolic stone dz: renal leak hypercalcuria.   Obesity, Class I, BMI 30-34.9    OSA (obstructive sleep apnea) approx 2008   was put on CPAP but says he fell out of the habit of using it due to "laziness".  Has CPAP machine but doesn't use it.   Osteoarthritis, multiple sites    Hands and right foot and both knees.  Ortho started meloxicam 08/2018.   Primary osteoarthritis of both knees    Vsco supplementation helpful in ths past.   Prostate cancer screening    Urol did DRE (wnl) and PSA 05/24/18. [facility] 0.89 Nov 24, 2019 at [facility]    Past Surgical History:  Procedure Laterality Date   ANTERIOR CRUCIATE LIGAMENT REPAIR  1991   ACL and PCL left knee   COLONOSCOPY  02/01/2018   Tubular adenoma, repeat 5 years    ETT  03/2015   NORMAL    Outpatient Medications Prior to Visit  Medication Sig Dispense Refill   atorvastatin (LIPITOR) 20 MG tablet TAKE 1 TABLET BY MOUTH  DAILY 90 tablet 3   indapamide (LOZOL) 1.25 MG tablet  TAKE 1 TABLET BY MOUTH IN  THE MORNING 90 tablet 0   meloxicam (MOBIC) 7.5 MG tablet TAKE 1 TABLET BY MOUTH  TWICE DAILY 180 tablet 3   metFORMIN (GLUCOPHAGE) 1000 MG tablet TAKE 1 TABLET BY MOUTH  TWICE DAILY WITH MEALS 180 tablet 1   omeprazole (PRILOSEC) 40 MG capsule TAKE 1 CAPSULE BY MOUTH  DAILY 90 capsule 1   pioglitazone (ACTOS) 15 MG tablet TAKE 1 TABLET BY MOUTH  DAILY 90 tablet 1   Potassium Citrate 15 MEQ (1620 MG) TBCR Take 1 tablet by mouth 2 (two) times daily. 180 tablet 3   HYDROcodone-acetaminophen (NORCO/VICODIN) 5-325 MG tablet Take 1-2 tablets by mouth every 6 (six) hours as needed. (Patient not taking: Reported on 04/25/2020) 15 tablet 0   tamsulosin (FLOMAX) 0.4 MG CAPS capsule Take 1 capsule (0.4 mg total) by mouth  daily. (Patient not taking: Reported on 04/25/2020) 10 capsule 0   No facility-administered medications prior to visit.    Allergies  Allergen Reactions   Amoxicillin Rash   Ciprofloxacin Hcl Rash    ROS As per HPI  PE: Vitals with BMI 08/30/2020 04/25/2020 02/17/2020  Height 6' 5.5" 6' 5.5" -  Weight 264 lbs 266 lbs 13 oz -  BMI 30.89 31.22 -  Systolic 147 122 009  Diastolic 88 76 89  Pulse 71 69 54   Manual bp repeat 130/90  Gen: Alert, well appearing.  Patient is oriented to person, place, time, and situation. AFFECT: pleasant, lucid thought and speech. CV: RRR, no m/r/g.   LUNGS: CTA bilat, nonlabored resps, good aeration in all lung fields.   LABS:  Lab Results  Component Value Date   TSH 2.87 08/24/2019   Lab Results  Component Value Date   WBC 6.7 08/24/2019   HGB 14.2 08/24/2019   HCT 41.6 08/24/2019   MCV 89.4 08/24/2019   PLT 278.0 08/24/2019   Lab Results  Component Value Date   CREATININE 1.02 04/25/2020   BUN 27 (H) 04/25/2020   NA 137 04/25/2020   K 3.7 04/25/2020   CL 103 04/25/2020   CO2 31 04/25/2020   Lab Results  Component Value Date   ALT 25 08/24/2019   AST 17 08/24/2019   ALKPHOS 79 08/24/2019   BILITOT 1.0 08/24/2019   Lab Results  Component Value Date   CHOL 101 08/24/2019   Lab Results  Component Value Date   HDL 40.10 08/24/2019   Lab Results  Component Value Date   LDLCALC 49 08/24/2019   Lab Results  Component Value Date   TRIG 59.0 08/24/2019   Lab Results  Component Value Date   CHOLHDL 3 08/24/2019   Lab Results  Component Value Date   PSA 0.89 11/24/2019   PSA 0.98 08/24/2019   PSA 1.06 12/06/2017   Lab Results  Component Value Date   HGBA1C 7.1 (H) 04/25/2020   IMPRESSION AND PLAN:  1) DM 2, no complications. No home monitoring. Tolerating recent addition of pioglitazone 15mg  qd to his metformin 1000 mg bid. Continue this. Check A1c and lytes/cr today. Continue to work on diet/exercise/wt  loss.  2) HLD: tolerating statin. Continue atorva 20mg  qd. Due for FLP and hepatic panel today.  3) Osteoarthritis multiple sites: mild/mod intensity at the most. Meloxicam 7.5mg  every morning somewhat helpful. Discussed negatives of chronic NSAID use, particularly in the context of DM---possible renal impairment.  Recommended he try tylenol in it's place OR add tylenol later in the day. We'll periodically be  monitoring renal function.  4) Elev bp reading in office, w/out dx of HTN: recheck with manual cuff lower. Home measurements at goal of 130/80. No meds at this time. Lytes/cr today.  An After Visit Summary was printed and given to the patient.  FOLLOW UP: Return in about 6 months (around 02/28/2021) for annual CPE (fasting).  Signed:  Santiago Bumpers, MD           08/30/2020

## 2020-08-31 LAB — COMPREHENSIVE METABOLIC PANEL
AG Ratio: 2.2 (calc) (ref 1.0–2.5)
ALT: 17 U/L (ref 9–46)
AST: 16 U/L (ref 10–35)
Albumin: 4.8 g/dL (ref 3.6–5.1)
Alkaline phosphatase (APISO): 68 U/L (ref 35–144)
BUN/Creatinine Ratio: 19 (calc) (ref 6–22)
BUN: 27 mg/dL — ABNORMAL HIGH (ref 7–25)
CO2: 25 mmol/L (ref 20–32)
Calcium: 9.8 mg/dL (ref 8.6–10.3)
Chloride: 103 mmol/L (ref 98–110)
Creat: 1.43 mg/dL — ABNORMAL HIGH (ref 0.70–1.33)
Globulin: 2.2 g/dL (calc) (ref 1.9–3.7)
Glucose, Bld: 88 mg/dL (ref 65–99)
Potassium: 3.9 mmol/L (ref 3.5–5.3)
Sodium: 139 mmol/L (ref 135–146)
Total Bilirubin: 1 mg/dL (ref 0.2–1.2)
Total Protein: 7 g/dL (ref 6.1–8.1)

## 2020-08-31 LAB — HEMOGLOBIN A1C
Hgb A1c MFr Bld: 6.5 % of total Hgb — ABNORMAL HIGH (ref <5.7)
Mean Plasma Glucose: 140 (calc)
eAG (mmol/L): 7.7 (calc)

## 2020-08-31 LAB — LIPID PANEL
Cholesterol: 101 mg/dL (ref <200)
HDL: 38 mg/dL — ABNORMAL LOW (ref >=40)
LDL Cholesterol (Calc): 42 mg/dL (calc)
Non-HDL Cholesterol (Calc): 63 mg/dL (calc) (ref <130)
Total CHOL/HDL Ratio: 2.7 (calc) (ref <5.0)
Triglycerides: 126 mg/dL (ref <150)

## 2020-09-23 ENCOUNTER — Other Ambulatory Visit: Payer: Self-pay

## 2020-09-23 ENCOUNTER — Encounter: Payer: Self-pay | Admitting: Family Medicine

## 2020-09-23 ENCOUNTER — Ambulatory Visit: Payer: BC Managed Care – PPO

## 2020-09-23 ENCOUNTER — Ambulatory Visit (INDEPENDENT_AMBULATORY_CARE_PROVIDER_SITE_OTHER): Payer: BC Managed Care – PPO

## 2020-09-23 DIAGNOSIS — R7989 Other specified abnormal findings of blood chemistry: Secondary | ICD-10-CM

## 2020-09-23 LAB — BASIC METABOLIC PANEL
BUN: 27 mg/dL — ABNORMAL HIGH (ref 6–23)
CO2: 24 mEq/L (ref 19–32)
Calcium: 9.2 mg/dL (ref 8.4–10.5)
Chloride: 105 mEq/L (ref 96–112)
Creatinine, Ser: 1.28 mg/dL (ref 0.40–1.50)
GFR: 64.01 mL/min (ref >60.00)
Glucose, Bld: 202 mg/dL — ABNORMAL HIGH (ref 70–99)
Potassium: 4.2 mEq/L (ref 3.5–5.1)
Sodium: 140 mEq/L (ref 135–145)

## 2020-09-24 ENCOUNTER — Other Ambulatory Visit: Payer: Self-pay

## 2020-09-24 DIAGNOSIS — N182 Chronic kidney disease, stage 2 (mild): Secondary | ICD-10-CM

## 2020-09-24 DIAGNOSIS — Q613 Polycystic kidney, unspecified: Secondary | ICD-10-CM

## 2020-10-14 ENCOUNTER — Encounter: Payer: Self-pay | Admitting: Family Medicine

## 2020-10-18 ENCOUNTER — Other Ambulatory Visit: Payer: Self-pay | Admitting: Internal Medicine

## 2020-10-18 DIAGNOSIS — I10 Essential (primary) hypertension: Secondary | ICD-10-CM | POA: Diagnosis not present

## 2020-10-18 DIAGNOSIS — Q6102 Congenital multiple renal cysts: Secondary | ICD-10-CM | POA: Diagnosis not present

## 2020-10-18 DIAGNOSIS — N200 Calculus of kidney: Secondary | ICD-10-CM

## 2020-10-18 DIAGNOSIS — N182 Chronic kidney disease, stage 2 (mild): Secondary | ICD-10-CM

## 2020-10-18 DIAGNOSIS — E1122 Type 2 diabetes mellitus with diabetic chronic kidney disease: Secondary | ICD-10-CM | POA: Diagnosis not present

## 2020-10-18 LAB — BASIC METABOLIC PANEL
BUN: 26 — AB (ref 4–21)
CO2: 27 — AB (ref 13–22)
Chloride: 100 (ref 99–108)
Creatinine: 1.2 (ref 0.6–1.3)
Glucose: 191
Potassium: 4 (ref 3.4–5.3)
Sodium: 139 (ref 137–147)

## 2020-10-18 LAB — COMPREHENSIVE METABOLIC PANEL
Albumin: 4.7 (ref 3.5–5.0)
Calcium: 9.8 (ref 8.7–10.7)
GFR calc Af Amer: 77
GFR calc non Af Amer: 67
Globulin: 1.8

## 2020-10-18 LAB — CBC AND DIFFERENTIAL
HCT: 41 (ref 41–53)
Hemoglobin: 14.2 (ref 13.5–17.5)
Platelets: 239 (ref 150–399)
WBC: 5.1

## 2020-10-18 LAB — HEPATIC FUNCTION PANEL
ALT: 11 (ref 10–40)
AST: 17 (ref 14–40)
Alkaline Phosphatase: 89 (ref 25–125)
Bilirubin, Total: 0.8

## 2020-10-18 LAB — CBC: RBC: 4.66 (ref 3.87–5.11)

## 2020-10-18 LAB — IRON,TIBC AND FERRITIN PANEL
Ferritin: 243
Iron: 131
TIBC: 361
UIBC: 230

## 2020-10-18 LAB — MICROALBUMIN, URINE: Microalb, Ur: 7.3

## 2020-10-18 LAB — VITAMIN D 25 HYDROXY (VIT D DEFICIENCY, FRACTURES): Vit D, 25-Hydroxy: 31.6

## 2020-10-25 ENCOUNTER — Ambulatory Visit
Admission: RE | Admit: 2020-10-25 | Discharge: 2020-10-25 | Disposition: A | Discharge status: Home / Self Care | Payer: BC Managed Care – PPO | Source: Ambulatory Visit | Attending: Internal Medicine | Admitting: Internal Medicine

## 2020-10-25 ENCOUNTER — Other Ambulatory Visit: Payer: Self-pay

## 2020-10-25 DIAGNOSIS — N182 Chronic kidney disease, stage 2 (mild): Secondary | ICD-10-CM | POA: Diagnosis not present

## 2020-10-25 DIAGNOSIS — N281 Cyst of kidney, acquired: Secondary | ICD-10-CM | POA: Diagnosis not present

## 2020-10-25 DIAGNOSIS — N200 Calculus of kidney: Secondary | ICD-10-CM | POA: Diagnosis not present

## 2020-10-25 DIAGNOSIS — Q6102 Congenital multiple renal cysts: Secondary | ICD-10-CM

## 2020-11-04 ENCOUNTER — Encounter: Payer: Self-pay | Admitting: Family Medicine

## 2020-11-15 DIAGNOSIS — N200 Calculus of kidney: Secondary | ICD-10-CM | POA: Diagnosis not present

## 2020-12-02 ENCOUNTER — Other Ambulatory Visit: Payer: Self-pay | Admitting: Family Medicine

## 2020-12-04 ENCOUNTER — Other Ambulatory Visit: Payer: Self-pay | Admitting: Family Medicine

## 2021-01-02 ENCOUNTER — Other Ambulatory Visit: Payer: Self-pay | Admitting: Family Medicine

## 2021-01-12 IMAGING — CR DG ABDOMEN 1V
2 series · 2 of 2 positions shown · non-contrast
Comparison: December 01, 2019

CLINICAL DATA: Flank pain

EXAM:
ABDOMEN - 1 VIEW

[t abdomen supine (1 of 2)]
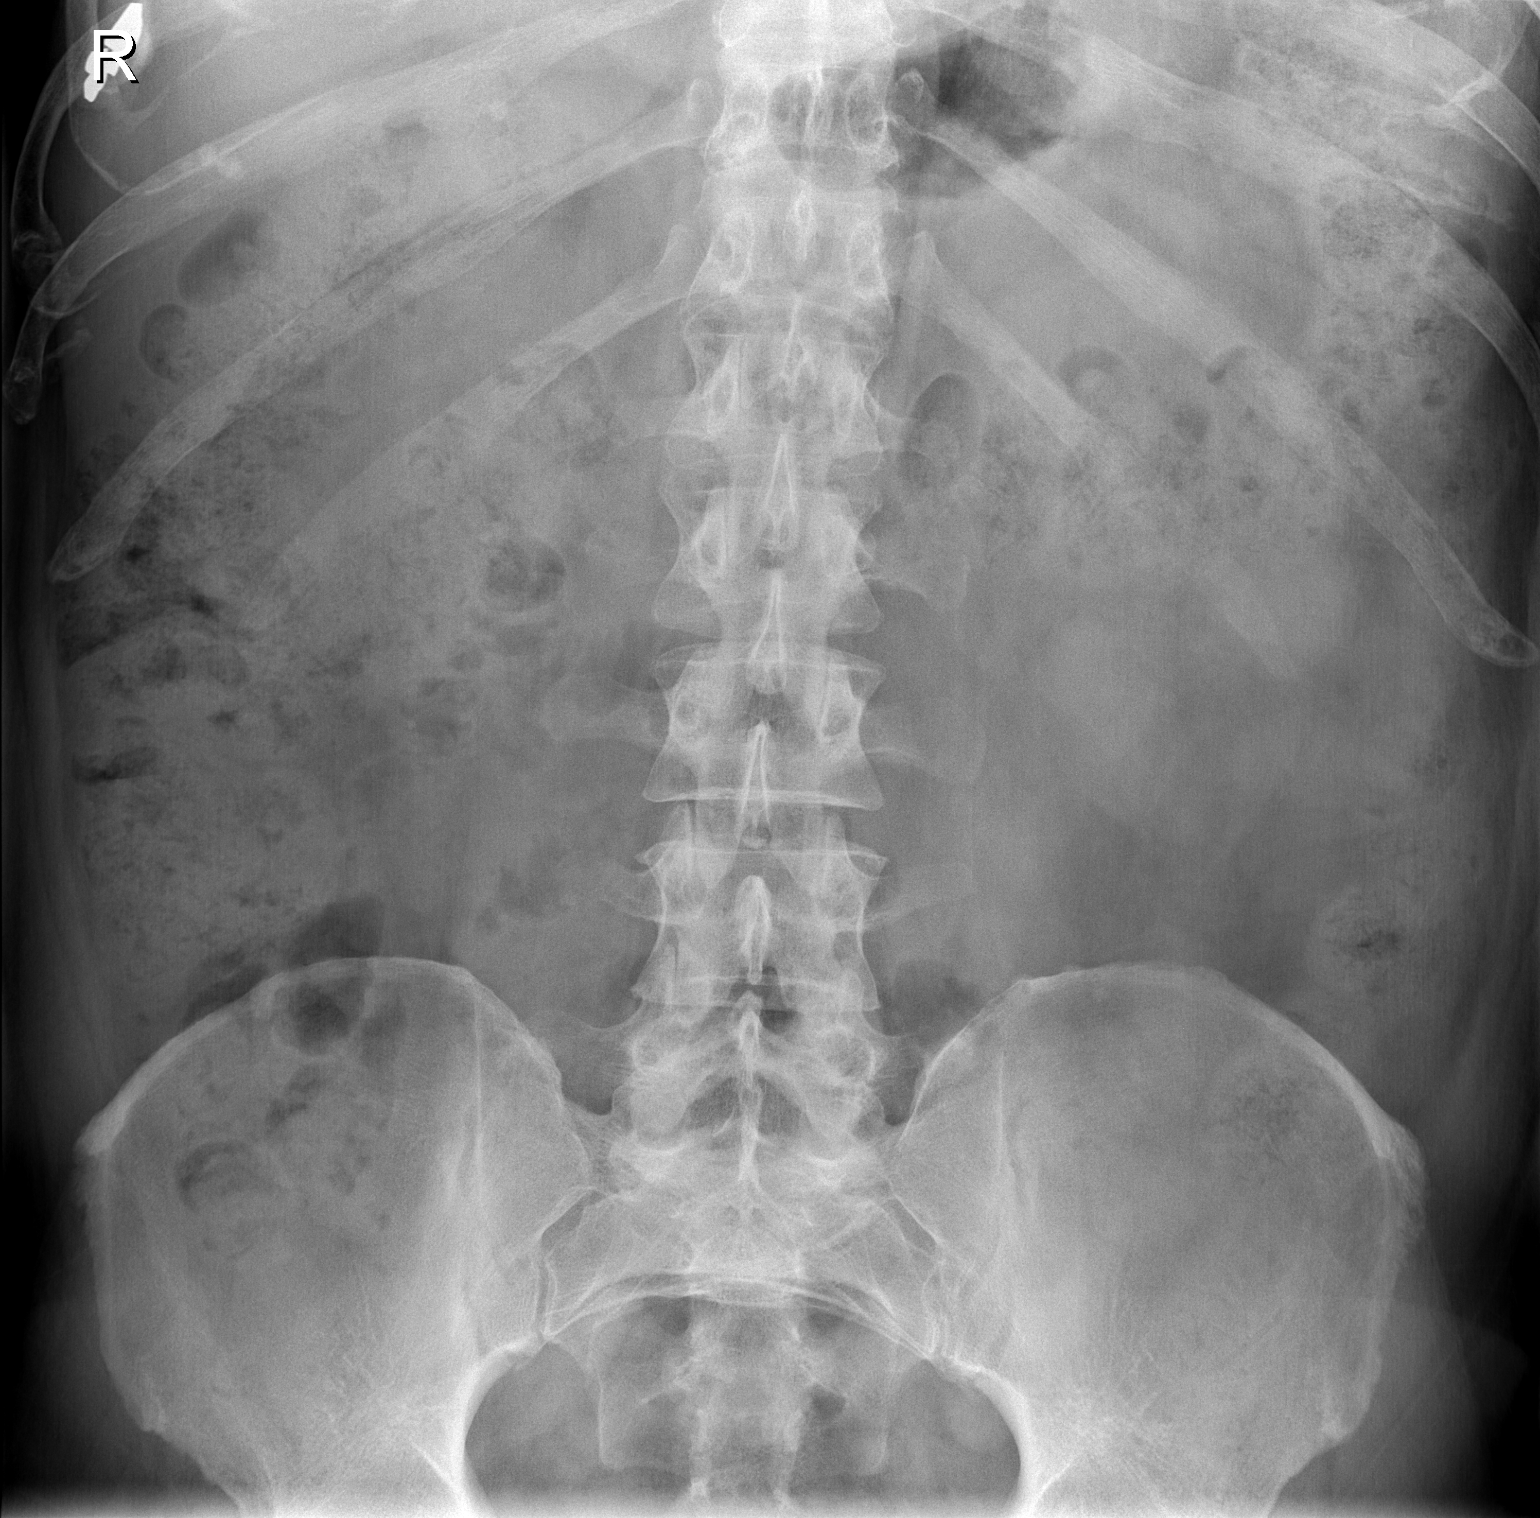

[t abdomen supine (2 of 2)]
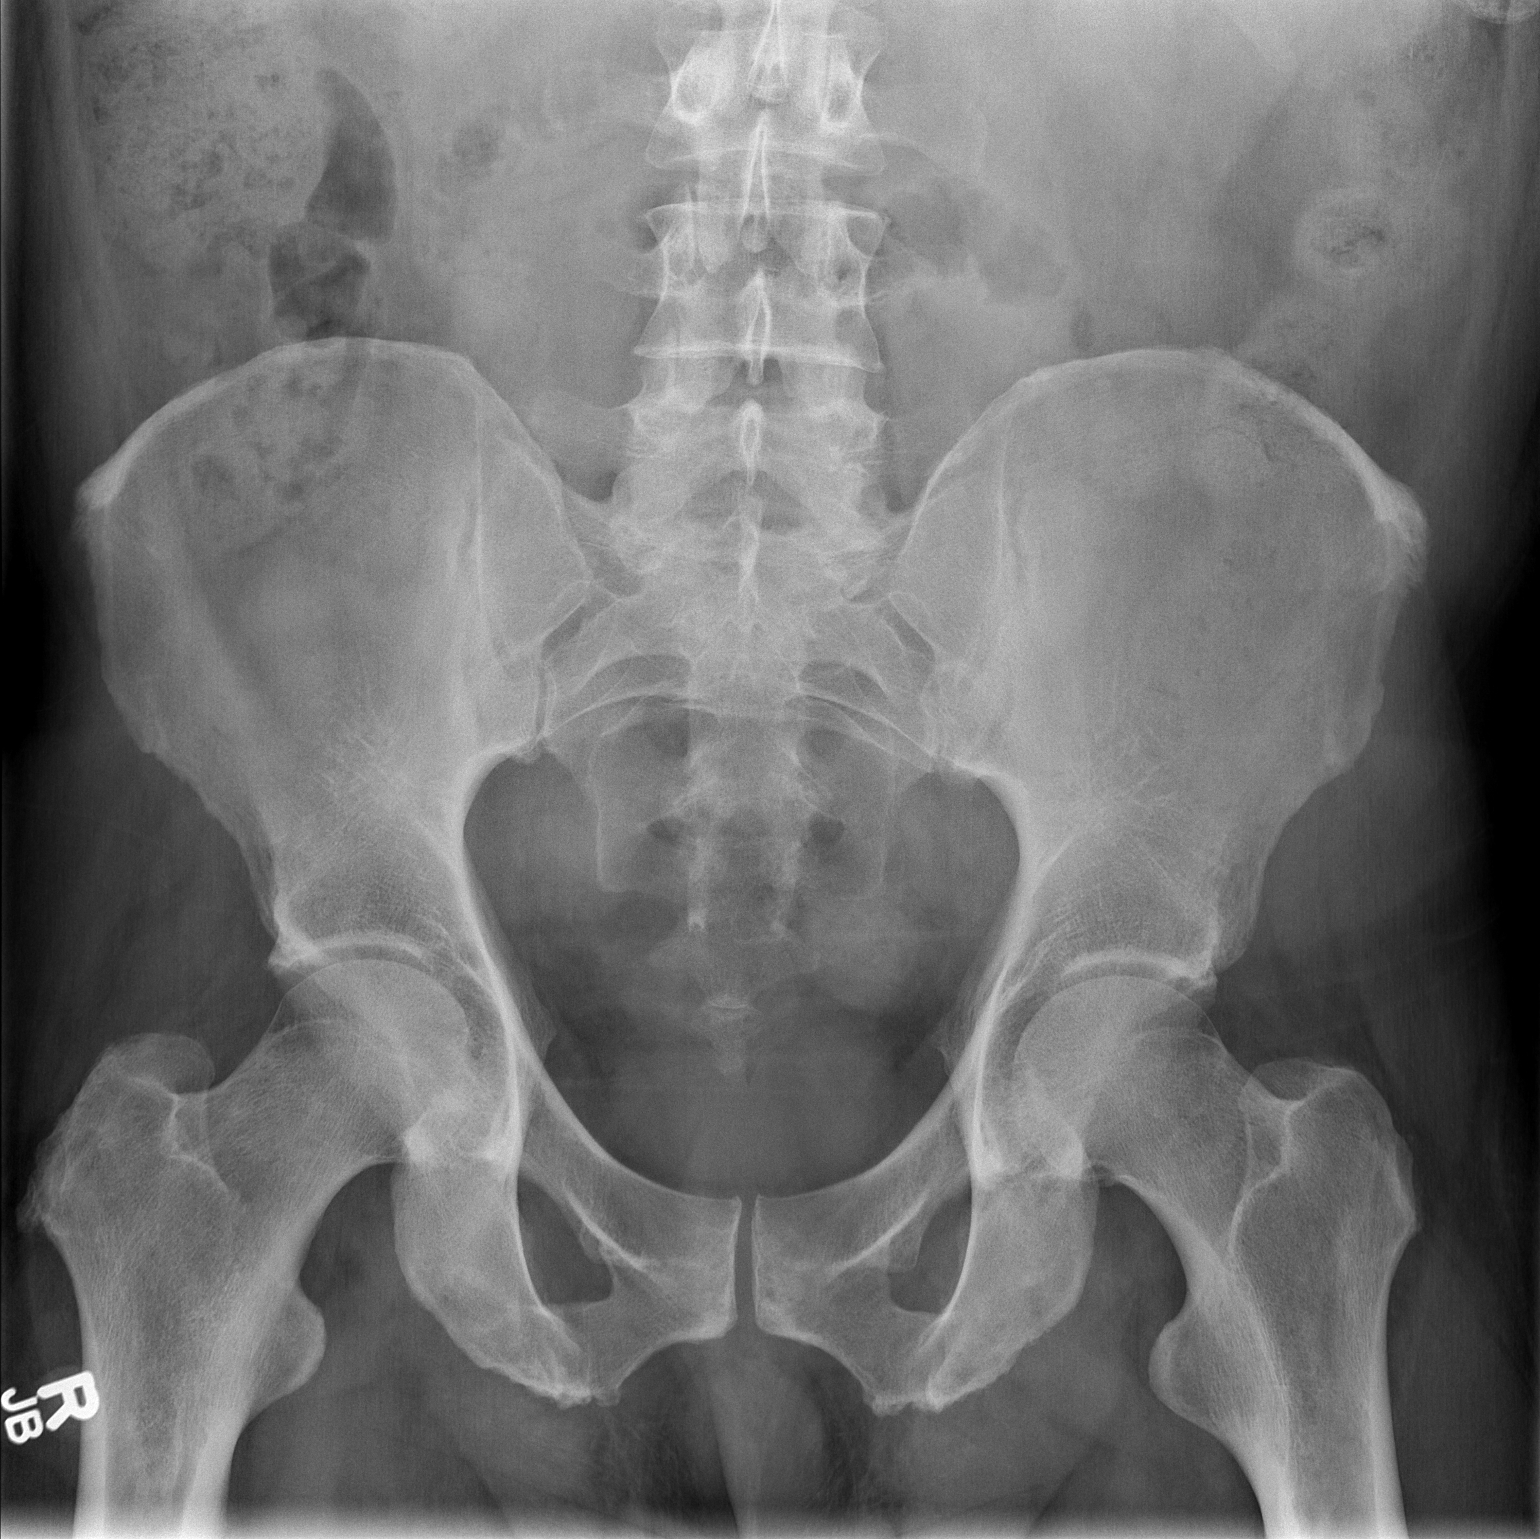

[2 of 2 positions shown; findings below may reference images not displayed]

FINDINGS: No abnormal calcifications are appreciable. Note that considerable
stool overlies the kidneys. There is fairly diffuse stool throughout
the colon. No bowel dilatation or air-fluid level to suggest bowel
obstruction. No evident free air.
IMPRESSION: No abnormal calcifications are appreciable. Note that significant
stool overlies the kidneys which could mask small calculi in this
area. No bowel obstruction or free air evident.

## 2021-01-12 IMAGING — CT CT RENAL STONE PROTOCOL
2 of 4 series · 16 of 46 positions shown, 18 images · non-contrast
Comparison: CT abdomen dated 11/12/2015.

CLINICAL DATA: Acute onset flank pain this morning. History of
renal stone.

EXAM:
CT ABDOMEN AND PELVIS WITHOUT CONTRAST
TECHNIQUE: Multidetector CT imaging of the abdomen and pelvis was performed
following the standard protocol without IV contrast.

[Series 2: axial st · axial · 0.97mm/px · z∈[-539,+16]mm · 13 of 121 slices shown, 15 images]
[im 5/121  soft-tissue]
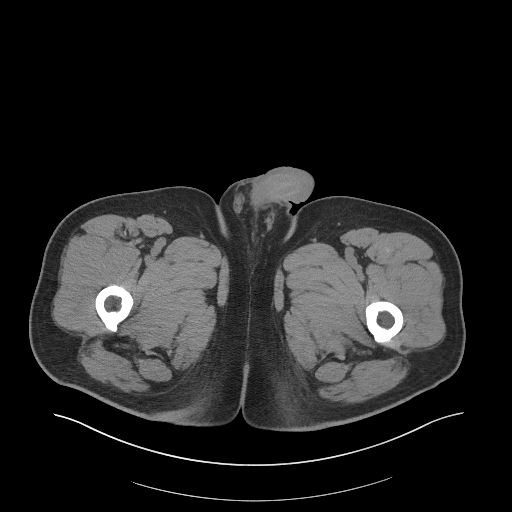
[im 5/121  bone]
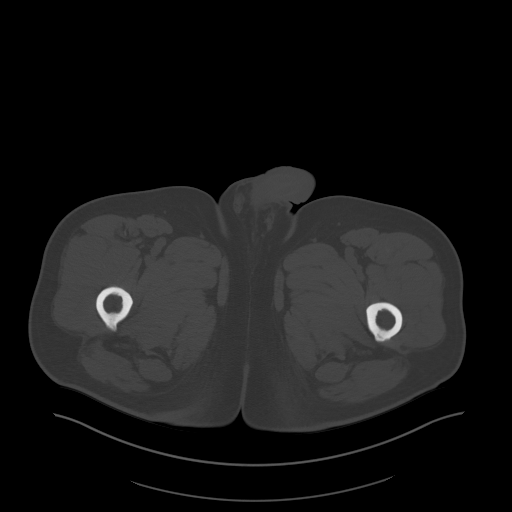
[im 15/121  soft-tissue]
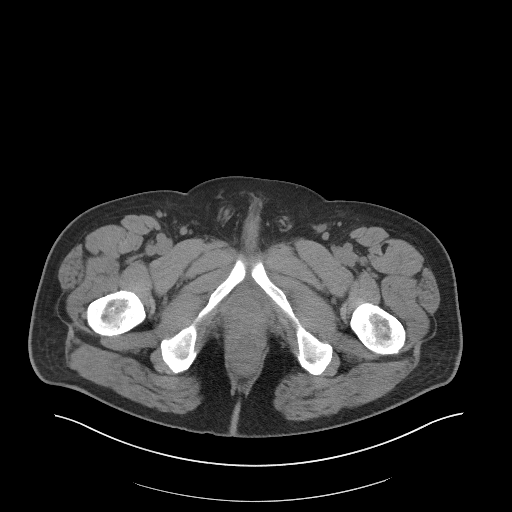
[im 25/121  soft-tissue]
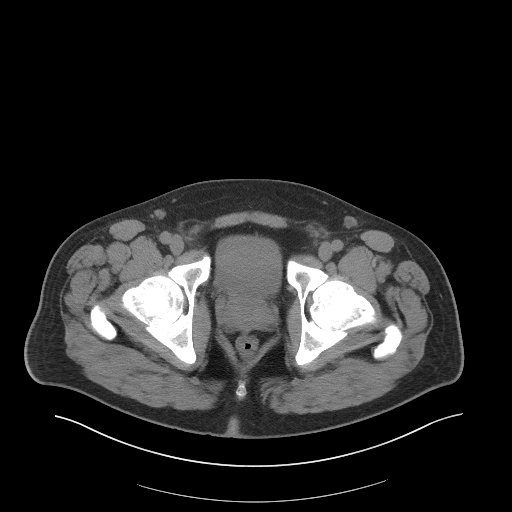
[im 34/121  soft-tissue]
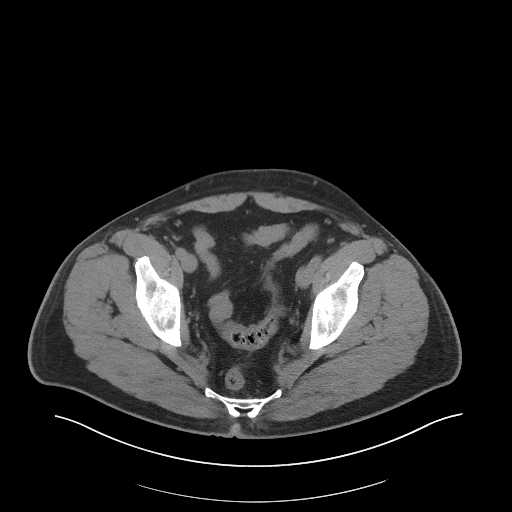
[im 44/121  soft-tissue]
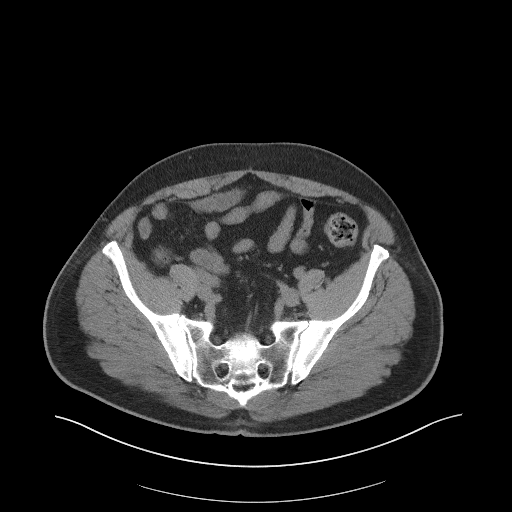
[im 53/121  soft-tissue]
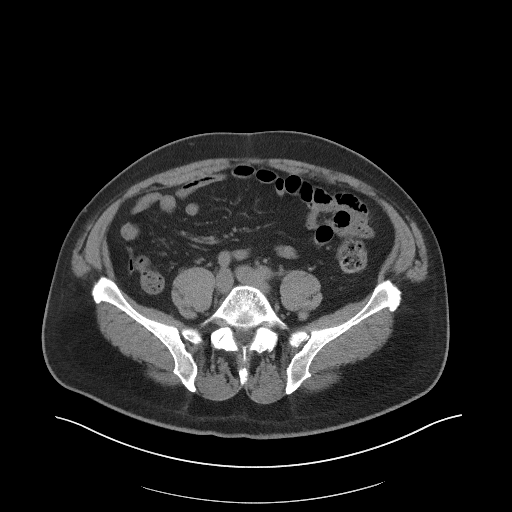
[im 63/121  soft-tissue]
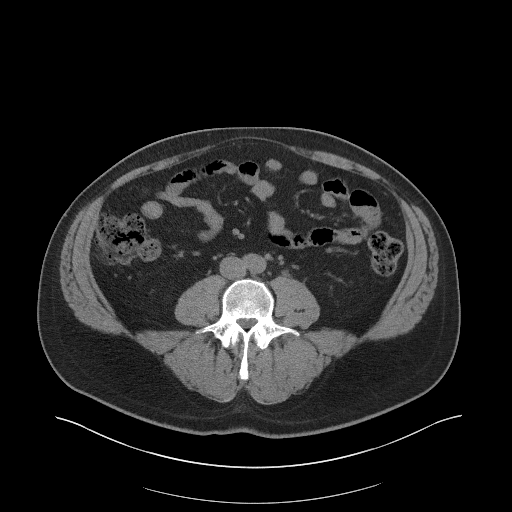
[im 68/121  soft-tissue]
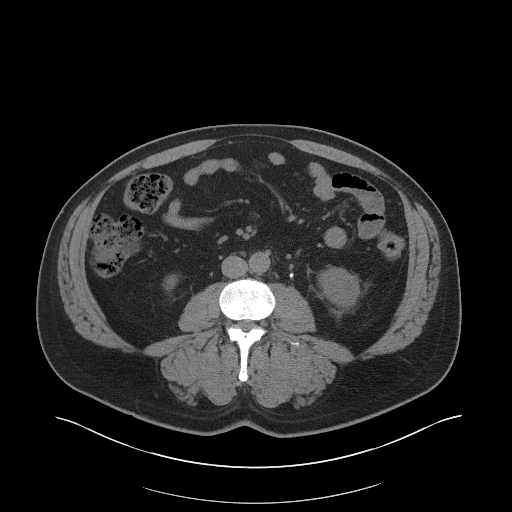
[im 77/121  soft-tissue]
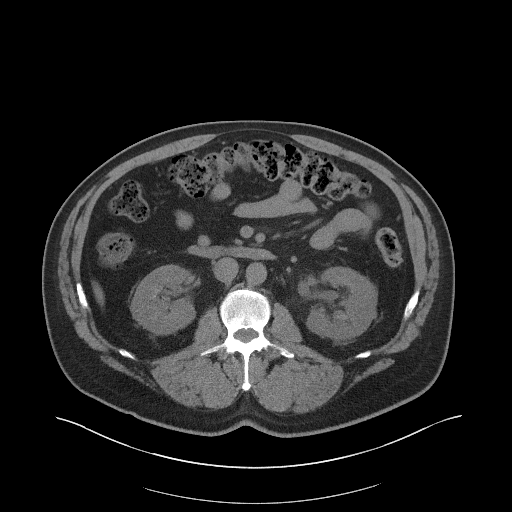
[im 77/121  bone]
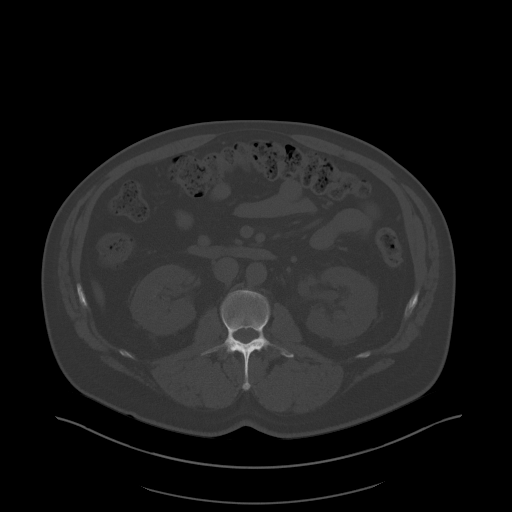
[im 87/121  soft-tissue]
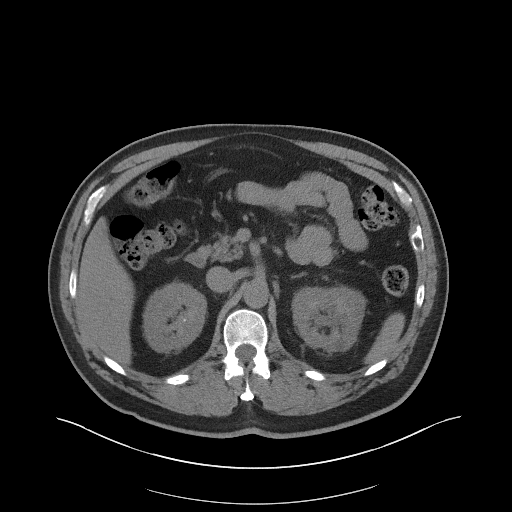
[im 97/121  soft-tissue]
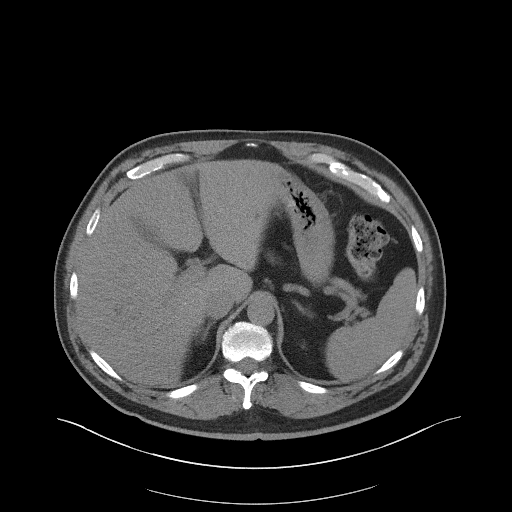
[im 106/121  soft-tissue]
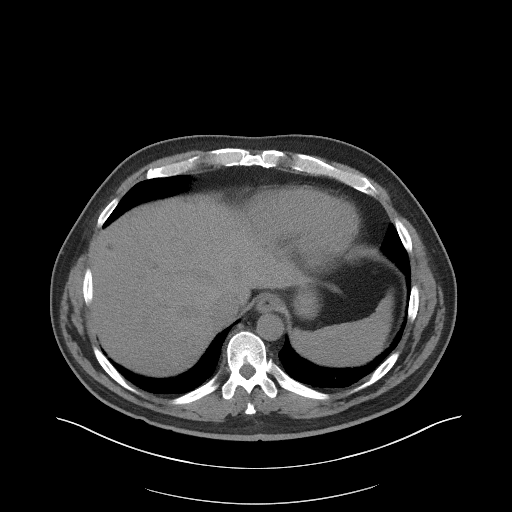
[im 116/121  soft-tissue]
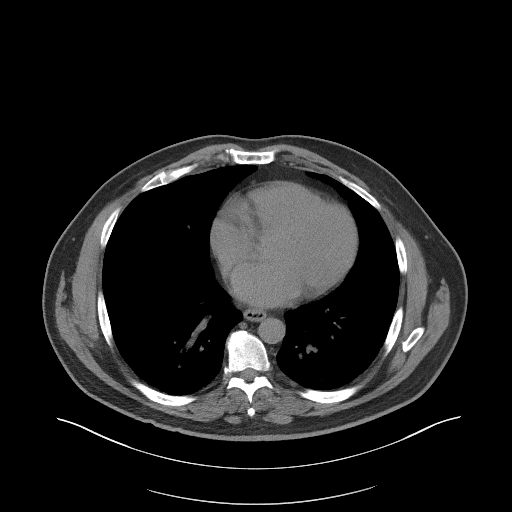

[Series 5: coronal st · coronal · 1.08mm/px · 3 of 102 slices shown]
[im 34/102  soft-tissue]
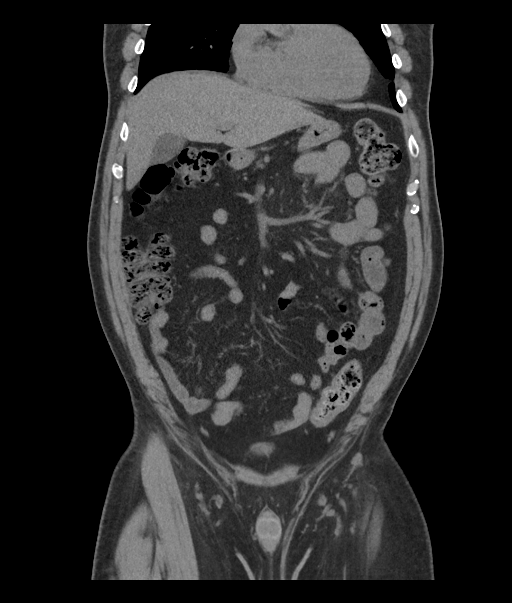
[im 45/102  soft-tissue]
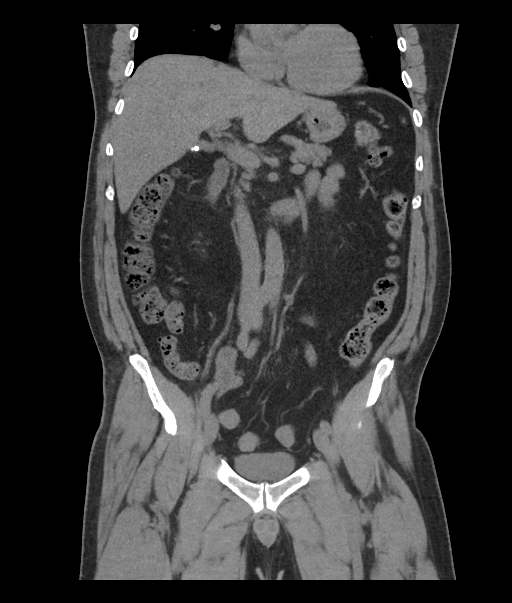
[im 57/102  soft-tissue]
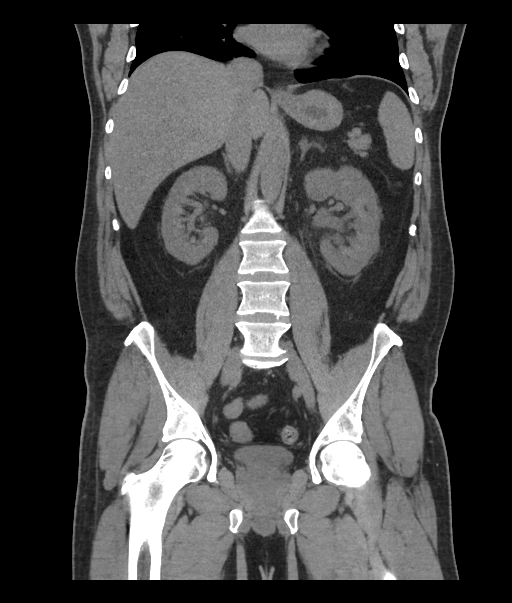

[16 of 46 positions shown; findings below may reference images not displayed]

FINDINGS: Lower chest: No acute abnormality.

Hepatobiliary: Numerous small/tiny hypodense foci scattered within
both liver lobes, too small to definitively characterize but
statistically most likely benign cysts. No suspicious finding within
the liver. Multiple small layering stones within the otherwise
normal-appearing gallbladder. No bile duct dilatation appreciated.

Pancreas: Unremarkable. No pancreatic ductal dilatation or
surrounding inflammatory changes.

Spleen: Normal in size without focal abnormality.

Adrenals/Urinary Tract: Adrenal glands appear normal. 4 mm stone
within the proximal LEFT ureter causing moderate LEFT-sided
hydronephrosis and perinephric edema.

Additional 1 mm in 4 mm LEFT renal stones. At least 2 punctate RIGHT
renal stones. No RIGHT-sided ureteral stone. No bladder stone.
Bladder is decompressed.

Stomach/Bowel: No dilated large or small bowel loops. No evidence of
bowel wall inflammation. Appendix is normal. Stomach is
unremarkable, partially decompressed.

Vascular/Lymphatic: No significant vascular findings are present. No
enlarged abdominal or pelvic lymph nodes.

Reproductive: Prostate is unremarkable.

Other: No free fluid or abscess collection. No free intraperitoneal
air.

Musculoskeletal: Degenerative spondylosis at the lumbosacral
junction, mild to moderate in degree. No acute or suspicious osseous
finding.
IMPRESSION: 1. 4 mm stone within the proximal LEFT ureter causing moderate
LEFT-sided hydronephrosis and perinephric edema.
2. Bilateral nephrolithiasis.
3. Cholelithiasis without evidence of acute cholecystitis.

## 2021-01-28 DIAGNOSIS — N182 Chronic kidney disease, stage 2 (mild): Secondary | ICD-10-CM | POA: Diagnosis not present

## 2021-01-28 DIAGNOSIS — I10 Essential (primary) hypertension: Secondary | ICD-10-CM | POA: Diagnosis not present

## 2021-01-28 DIAGNOSIS — Q6102 Congenital multiple renal cysts: Secondary | ICD-10-CM | POA: Diagnosis not present

## 2021-01-28 DIAGNOSIS — E1122 Type 2 diabetes mellitus with diabetic chronic kidney disease: Secondary | ICD-10-CM | POA: Diagnosis not present

## 2021-01-28 LAB — COMPREHENSIVE METABOLIC PANEL
Albumin: 4.7 (ref 3.5–5.0)
Calcium: 9.7 (ref 8.7–10.7)

## 2021-01-28 LAB — BASIC METABOLIC PANEL
BUN: 26 — AB (ref 4–21)
CO2: 28 — AB (ref 13–22)
Chloride: 103 (ref 99–108)
Creatinine: 1.3 (ref 0.6–1.3)
Glucose: 128
Potassium: 4 (ref 3.4–5.3)
Sodium: 139 (ref 137–147)

## 2021-01-29 ENCOUNTER — Other Ambulatory Visit: Payer: Self-pay | Admitting: Family Medicine

## 2021-02-05 ENCOUNTER — Encounter: Payer: Self-pay | Admitting: Family Medicine

## 2021-02-13 DIAGNOSIS — N182 Chronic kidney disease, stage 2 (mild): Secondary | ICD-10-CM | POA: Diagnosis not present

## 2021-02-18 DIAGNOSIS — N200 Calculus of kidney: Secondary | ICD-10-CM | POA: Diagnosis not present

## 2021-02-18 DIAGNOSIS — Z125 Encounter for screening for malignant neoplasm of prostate: Secondary | ICD-10-CM | POA: Diagnosis not present

## 2021-02-18 LAB — PSA: PSA: 1.08

## 2021-02-25 DIAGNOSIS — N200 Calculus of kidney: Secondary | ICD-10-CM | POA: Diagnosis not present

## 2021-02-25 DIAGNOSIS — N5201 Erectile dysfunction due to arterial insufficiency: Secondary | ICD-10-CM | POA: Diagnosis not present

## 2021-02-25 DIAGNOSIS — Q6102 Congenital multiple renal cysts: Secondary | ICD-10-CM | POA: Diagnosis not present

## 2021-02-28 ENCOUNTER — Encounter: Payer: BC Managed Care – PPO | Admitting: Family Medicine

## 2021-03-07 ENCOUNTER — Encounter: Payer: BC Managed Care – PPO | Admitting: Family Medicine

## 2021-03-10 ENCOUNTER — Encounter: Payer: Self-pay | Admitting: Family Medicine

## 2021-03-19 ENCOUNTER — Other Ambulatory Visit: Payer: Self-pay

## 2021-03-20 ENCOUNTER — Encounter: Payer: Self-pay | Admitting: Family Medicine

## 2021-03-20 ENCOUNTER — Ambulatory Visit (INDEPENDENT_AMBULATORY_CARE_PROVIDER_SITE_OTHER): Payer: BC Managed Care – PPO | Admitting: Family Medicine

## 2021-03-20 VITALS — BP 117/78 | HR 68 | Temp 97.7°F | Resp 16 | Ht 76.0 in | Wt 267.4 lb

## 2021-03-20 DIAGNOSIS — E7800 Pure hypercholesterolemia, unspecified: Secondary | ICD-10-CM | POA: Diagnosis not present

## 2021-03-20 DIAGNOSIS — Z0000 Encounter for general adult medical examination without abnormal findings: Secondary | ICD-10-CM | POA: Diagnosis not present

## 2021-03-20 DIAGNOSIS — E119 Type 2 diabetes mellitus without complications: Secondary | ICD-10-CM

## 2021-03-20 DIAGNOSIS — K5909 Other constipation: Secondary | ICD-10-CM | POA: Diagnosis not present

## 2021-03-20 DIAGNOSIS — Z1159 Encounter for screening for other viral diseases: Secondary | ICD-10-CM | POA: Diagnosis not present

## 2021-03-20 DIAGNOSIS — N182 Chronic kidney disease, stage 2 (mild): Secondary | ICD-10-CM

## 2021-03-20 DIAGNOSIS — N2889 Other specified disorders of kidney and ureter: Secondary | ICD-10-CM

## 2021-03-20 NOTE — Patient Instructions (Signed)
You can take 1 cap full of miralax three times per day. Also you can try adding an otc med called senakot S (generic equivalent is fine), 2 tabs every day.

## 2021-03-20 NOTE — Progress Notes (Signed)
Office Note 03/20/2021  CC:  Chief Complaint  Patient presents with  . Annual Exam    Fasting     HPI:  Adrian Cook is a 55 y.o. male who is here for annual health maintenance exam and 7 mo f/u DM w/out complication, HLD, chronic NSAID use in the setting of CRI II/III (GFR low 60s), and hx of elev bp w/out dx HTN. A/P as of last visit: "1) DM 2, no complications. No home monitoring. Tolerating recent addition of pioglitazone 15mg  qd to his metformin 1000 mg bid. Continue this. Check A1c and lytes/cr today. Continue to work on diet/exercise/wt loss.  2) HLD: tolerating statin. Continue atorva 20mg  qd. Due for FLP and hepatic panel today.  3) Osteoarthritis multiple sites: mild/mod intensity at the most. Meloxicam 7.5mg  every morning somewhat helpful. Discussed negatives of chronic NSAID use, particularly in the context of DM---possible renal impairment.  Recommended he try tylenol in it's place OR add tylenol later in the day. We'll periodically be monitoring renal function.  4) Elev bp reading in office, w/out dx of HTN: recheck with manual cuff lower. Home measurements at goal of 130/80. No meds at this time. Lytes/cr today."  INTERIM HX: Doing ok, still working as a . He is fasting x 6 hrs today.  DM: no home gluc monitoring.  Compliant with pioglit and metformin.  Diet is fair.  Feet: chronic mild numbness in distal 1/3 of each foot.  Hx of elev bp w/out dx HTN: home bp's 130s/70s consistently.  Chronic bilat knees arthritis pain, low back on and off pain, neck as well.  No radiculopathy sx's. He has stopped taking meloxicam completely.  Has taken 1000 mg tylenol most mornings and it helps a little.    Past Medical History:  Diagnosis Date  . Atypical chest pain 2016   ETT 03/2015 NORMAL  . 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  . Chronic renal insufficiency, stage 2 (mild)    borderline II/III: nephrol eval 10/2020->suspected cause is combo of recurrent kidney stones, polycystic kidney dz, HTN, and DM.  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  No mass effect or enhancing lesions as of 2019 MRI.  Renal u/s kidneys about 14 cm bilat, multiple benign cysts, 2 kidney stones on L. Neph plans f/u CT 2025 to monitor prog of kidney size.  . Constipation   . Diabetes mellitus without complication (HCC) 10/2017   Dx'd at [facility] CPE  . ED (erectile dysfunction)    Sildenafil trial by urol 05/2018.  . Gallstones    asymptomatic  . GERD (gastroesophageal reflux disease)    Dr. 11/2017  . Heart murmur    Found at age 58, w/u unrevealing.  No cardiac symptoms.  . Kidney stones    [facility]: takes indapamide 1.25mg  qd + K cit bid.   Metabolic stone dz: renal leak hypercalcuria.  . Obesity, Class I, BMI 30-34.9   . OSA (obstructive sleep apnea) approx 2008   was put on CPAP but says he fell out of the habit of using it due to "laziness".  Has CPAP machine but doesn't use it.  . Osteoarthritis, multiple sites    Hands and right foot and both knees.  Ortho started meloxicam 08/2018.  2009 Primary osteoarthritis of both knees  Vsco supplementation helpful in ths past.  . Prostate cancer screening    Urol did DRE (wnl) and PSA 05/24/18. PSA 0.89 Nov 24, 2019 at [facility]. 02/18/21 PSA 1.1 at urol.  . Simple hepatic cyst    many, tiny    Past Surgical History:  Procedure Laterality Date  . ANTERIOR CRUCIATE LIGAMENT REPAIR  1991   ACL and PCL left knee  . COLONOSCOPY  02/01/2018   Tubular adenoma, repeat 5 years   . ETT  03/2015   NORMAL  . US RENAL/AORTA     14cm bilaterally with multiple cysts mostly benign and 2 kideny stones on the left side. Repeat CT in 2025    Family History  Problem Relation Age of  Onset  . Osteoarthritis Mother   . Arthritis Mother   . Heart failure Father   . Leukemia Maternal Grandfather   . Colon cancer Neg Hx   . Prostate cancer Neg Hx     Social History   Socioeconomic History  . Marital status: Married    Spouse name: Not on file  . Number of children: Not on file  . Years of education: Not on file  . Highest education level: Not on file  Occupational History  . Not on file  Tobacco Use  . Smoking status: Never Smoker  . Smokeless tobacco: Never Used  Vaping Use  . Vaping Use: Never used  Substance and Sexual Activity  . Alcohol use: No    Alcohol/week: 0.0 standard drinks  . Drug use: No  . Sexual activity: Not on file  Other Topics Concern  . Not on file  Social History Narrative   Married, no children.   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   No T/A/Ds.   Social Determinants of Health   Financial Resource Strain: Not on file  Food Insecurity: Not on file  Transportation Needs: Not on file  Physical Activity: Not on file  Stress: Not on file  Social Connections: Not on file  Intimate Partner Violence: Not on file    Outpatient Medications Prior to Visit  Medication Sig Dispense Refill  . atorvastatin (LIPITOR) 20 MG tablet TAKE 1 TABLET BY MOUTH  DAILY 90 tablet 1  . indapamide (LOZOL) 2.5 MG tablet Take 2.5 mg by mouth daily.    . metFORMIN (GLUCOPHAGE) 1000 MG tablet TAKE 1 TABLET BY MOUTH  TWICE DAILY WITH MEALS 180 tablet 1  . omeprazole (PRILOSEC) 40 MG capsule TAKE 1 CAPSULE BY MOUTH  DAILY 90 capsule 0  . pioglitazone (ACTOS) 15 MG tablet TAKE 1 TABLET BY MOUTH  DAILY 90 tablet 1  . Potassium Citrate 15 MEQ (1620 MG) TBCR TAKE 1 TABLET BY MOUTH  TWICE DAILY 180 tablet 1  . indapamide (LOZOL) 1.25 MG tablet TAKE 1 TABLET BY MOUTH IN  THE MORNING (Patient not taking: Reported on 03/20/2021) 90 tablet 0  . meloxicam (MOBIC) 7.5 MG tablet TAKE 1 TABLET BY MOUTH  TWICE DAILY (Patient not taking:  Reported on 03/20/2021) 180 tablet 3  . tamsulosin (FLOMAX) 0.4 MG CAPS capsule Take 1 capsule (0.4 mg total) by mouth daily. (Patient not taking: Reported on 03/20/2021) 30 capsule 1   No facility-administered medications prior to visit.    Allergies  Allergen Reactions  . Amoxicillin Rash  . Ciprofloxacin Hcl Rash   Review of Systems  Constitutional: Negative for appetite change, chills, fatigue and fever.  HENT: Negative for congestion, dental problem, ear pain  and sore throat.   Eyes: Negative for discharge, redness and visual disturbance.  Respiratory: Negative for cough, chest tightness, shortness of breath and wheezing.   Cardiovascular: Negative for chest pain, palpitations and leg swelling.  Gastrointestinal: Positive for constipation. Negative for abdominal pain, blood in stool, diarrhea, nausea and vomiting.  Genitourinary: Negative for difficulty urinating, dysuria, flank pain, frequency, hematuria and urgency.  Musculoskeletal: Positive for arthralgias (chronic in knees, low back). Negative for back pain, joint swelling, myalgias and neck stiffness.  Skin: Negative for pallor and rash.  Neurological: Negative for dizziness, speech difficulty, weakness and headaches.  Hematological: Negative for adenopathy. Does not bruise/bleed easily.  Psychiatric/Behavioral: Negative for confusion and sleep disturbance. The patient is not nervous/anxious.    PE; Vitals with BMI 03/20/2021 08/30/2020 04/25/2020  Height 6\' 4"  6' 5.5" 6' 5.5"  Weight 267 lbs 6 oz 264 lbs 266 lbs 13 oz  BMI 32.56 30.89 31.22  Systolic 117 147 [id number]  Diastolic 78 88 76  Pulse 68 71 69     Gen: Alert, well appearing.  Patient is oriented to person, place, time, and situation. AFFECT: pleasant, lucid thought and speech. ENT: Ears: EACs clear, normal epithelium.  TMs with good light reflex and landmarks bilaterally.  Eyes: no injection, icteris, swelling, or exudate.  EOMI, PERRLA. Nose: no drainage or turbinate  edema/swelling.  No injection or focal lesion.  Mouth: lips without lesion/swelling.  Oral mucosa pink and moist.  Dentition intact and without obvious caries or gingival swelling.  Oropharynx without erythema, exudate, or swelling.  Neck: supple/nontender.  No LAD, mass, or TM.  Carotid pulses 2+ bilaterally, without bruits. CV: RRR, no m/r/g.   LUNGS: CTA bilat, nonlabored resps, good aeration in all lung fields. ABD: soft, NT, ND, BS normal.  No hepatospenomegaly or mass.  No bruits. EXT: no clubbing, cyanosis, or edema.  Musculoskeletal: no joint swelling, erythema, warmth, or tenderness.  ROM of all joints intact. Skin - no sores or suspicious lesions or rashes or color changes   Pertinent labs:  Lab Results  Component Value Date   TSH 2.87 08/24/2019   Lab Results  Component Value Date   WBC 5.1 10/18/2020   HGB 14.2 10/18/2020   HCT 41 10/18/2020   MCV 89.4 08/24/2019   PLT 239 10/18/2020   Lab Results  Component Value Date   CREATININE 1.3 01/28/2021   BUN 26 (A) 01/28/2021   NA 139 01/28/2021   K 4.0 01/28/2021   CL 103 01/28/2021   CO2 28 (A) 01/28/2021   Lab Results  Component Value Date   ALT 11 10/18/2020   AST 17 10/18/2020   ALKPHOS 89 10/18/2020   BILITOT 1.0 08/30/2020   Lab Results  Component Value Date   CHOL 101 08/30/2020   Lab Results  Component Value Date   HDL 38 (L) 08/30/2020   Lab Results  Component Value Date   LDLCALC 42 08/30/2020   Lab Results  Component Value Date   TRIG 126 08/30/2020   Lab Results  Component Value Date   CHOLHDL 2.7 08/30/2020   Lab Results  Component Value Date   PSA 1.08 02/18/2021   PSA 0.89 11/24/2019   PSA 0.98 08/24/2019   Lab Results  Component Value Date   HGBA1C 6.5 (H) 08/30/2020   ASSESSMENT AND PLAN:   1) DM with mild DPN Hba1c today. Feet exam today.  2) HLD: tolerating atorva 20 qd. LDL was 42 seven mo ago->FLP and hepatic panel ordered.  3) CRI II/III: GFR low 60s. NSAIDs :  none anymore. Lytes/cr today.  4) chronic constipation: may increase miralax to bid or tid OR he can continue qd miralax and add senakot s 2 tabs qd.  5) Health maintenance exam: Reviewed age and gender appropriate health maintenance issues (prudent diet, regular exercise, health risks of tobacco and excessive alcohol, use of seatbelts, fire alarms in home, use of sunscreen).  Also reviewed age and gender appropriate health screening as well as vaccine recommendations. Vaccines: ALL UTD. Labs: cmet, flp, a1c ordered, hep c screening.  Prostate ca screening: screening per urologist->most recent PSA was 1.08 on 02/18/21. Colon ca screening: next colonoscopy due 2024.  An After Visit Summary was printed and given to the patient.  FOLLOW UP:  Return in about 6 months (around 09/20/2021) for annual CPE (fasting)+ RCI.  Signed:  Santiago Bumpers, MD           03/20/2021

## 2021-03-21 LAB — COMPREHENSIVE METABOLIC PANEL
ALT: 21 U/L (ref 0–53)
AST: 17 U/L (ref 0–37)
Albumin: 4.8 g/dL (ref 3.5–5.2)
Alkaline Phosphatase: 70 U/L (ref 39–117)
BUN: 25 mg/dL — ABNORMAL HIGH (ref 6–23)
CO2: 29 mEq/L (ref 19–32)
Calcium: 10 mg/dL (ref 8.4–10.5)
Chloride: 100 mEq/L (ref 96–112)
Creatinine, Ser: 1.29 mg/dL (ref 0.40–1.50)
GFR: 63.2 mL/min (ref >60.00)
Glucose, Bld: 96 mg/dL (ref 70–99)
Potassium: 3.9 mEq/L (ref 3.5–5.1)
Sodium: 139 mEq/L (ref 135–145)
Total Bilirubin: 1.2 mg/dL (ref 0.2–1.2)
Total Protein: 7.2 g/dL (ref 6.0–8.3)

## 2021-03-21 LAB — LIPID PANEL
Cholesterol: 116 mg/dL (ref 0–200)
HDL: 45.6 mg/dL (ref >39.00)
LDL Cholesterol: 55 mg/dL (ref 0–99)
NonHDL: 70.8
Total CHOL/HDL Ratio: 3
Triglycerides: 78 mg/dL (ref 0.0–149.0)
VLDL: 15.6 mg/dL (ref 0.0–40.0)

## 2021-03-21 LAB — HEPATITIS C ANTIBODY
Hepatitis C Ab: NONREACTIVE
SIGNAL TO CUT-OFF: 0.01 (ref <1.00)

## 2021-03-21 LAB — HEMOGLOBIN A1C: Hgb A1c MFr Bld: 7 % — ABNORMAL HIGH (ref 4.6–6.5)

## 2021-03-25 ENCOUNTER — Other Ambulatory Visit: Payer: Self-pay

## 2021-03-25 MED ORDER — PIOGLITAZONE HCL 30 MG PO TABS: 30.0000 mg | ORAL_TABLET | Freq: Every day | ORAL | 1 refills | Status: DC

## 2021-05-15 ENCOUNTER — Other Ambulatory Visit: Payer: Self-pay | Admitting: Family Medicine

## 2021-05-30 ENCOUNTER — Other Ambulatory Visit: Payer: Self-pay | Admitting: Family Medicine

## 2021-06-11 DIAGNOSIS — M722 Plantar fascial fibromatosis: Secondary | ICD-10-CM | POA: Diagnosis not present

## 2021-07-30 DIAGNOSIS — N182 Chronic kidney disease, stage 2 (mild): Secondary | ICD-10-CM | POA: Diagnosis not present

## 2021-07-30 DIAGNOSIS — I10 Essential (primary) hypertension: Secondary | ICD-10-CM | POA: Diagnosis not present

## 2021-07-30 DIAGNOSIS — Q6102 Congenital multiple renal cysts: Secondary | ICD-10-CM | POA: Diagnosis not present

## 2021-07-30 DIAGNOSIS — E1122 Type 2 diabetes mellitus with diabetic chronic kidney disease: Secondary | ICD-10-CM | POA: Diagnosis not present

## 2021-08-02 ENCOUNTER — Other Ambulatory Visit: Payer: Self-pay | Admitting: Family Medicine

## 2021-08-12 ENCOUNTER — Other Ambulatory Visit: Payer: Self-pay | Admitting: Family Medicine

## 2021-09-01 DIAGNOSIS — D3131 Benign neoplasm of right choroid: Secondary | ICD-10-CM | POA: Diagnosis not present

## 2021-09-01 DIAGNOSIS — E119 Type 2 diabetes mellitus without complications: Secondary | ICD-10-CM | POA: Diagnosis not present

## 2021-09-01 DIAGNOSIS — D3132 Benign neoplasm of left choroid: Secondary | ICD-10-CM | POA: Diagnosis not present

## 2021-09-01 LAB — HM DIABETES EYE EXAM

## 2021-09-11 ENCOUNTER — Ambulatory Visit: Payer: BC Managed Care – PPO | Admitting: Family Medicine

## 2021-09-11 ENCOUNTER — Encounter: Payer: Self-pay | Admitting: Family Medicine

## 2021-09-11 ENCOUNTER — Other Ambulatory Visit: Payer: Self-pay

## 2021-09-11 VITALS — BP 146/79 | HR 88 | Temp 98.1°F | Ht 76.0 in | Wt 268.8 lb

## 2021-09-11 DIAGNOSIS — Z23 Encounter for immunization: Secondary | ICD-10-CM

## 2021-09-11 DIAGNOSIS — N182 Chronic kidney disease, stage 2 (mild): Secondary | ICD-10-CM

## 2021-09-11 DIAGNOSIS — E7800 Pure hypercholesterolemia, unspecified: Secondary | ICD-10-CM

## 2021-09-11 DIAGNOSIS — E119 Type 2 diabetes mellitus without complications: Secondary | ICD-10-CM

## 2021-09-11 DIAGNOSIS — F4321 Adjustment disorder with depressed mood: Secondary | ICD-10-CM | POA: Diagnosis not present

## 2021-09-11 MED ORDER — ESCITALOPRAM OXALATE 5 MG PO TABS: ORAL_TABLET | ORAL | 1 refills | Status: DC

## 2021-09-11 NOTE — Progress Notes (Signed)
OFFICE VISIT  09/11/2021  CC:  Chief Complaint  Patient presents with   Follow-up    RCI, pt is not fasting   HPI:    Patient is a 55 y.o. male who presents for 6 mo f/u DM, HLD, CRI II/III. A/P as of last visit: "1) DM with mild DPN Hba1c today. Feet exam today.   2) HLD: tolerating atorva 20 qd. LDL was 42 seven mo ago->FLP and hepatic panel ordered.   3) CRI II/III: GFR low 60s. NSAIDs : none anymore. Lytes/cr today.   4) chronic constipation: may increase miralax to bid or tid OR he can continue qd miralax and add senakot s 2 tabs qd.   5) Health maintenance exam: Reviewed age and gender appropriate health maintenance issues (prudent diet, regular exercise, health risks of tobacco and excessive alcohol, use of seatbelts, fire alarms in home, use of sunscreen).  Also reviewed age and gender appropriate health screening as well as vaccine recommendations. Vaccines: ALL UTD. Labs: cmet, flp, a1c ordered, hep c screening.  Prostate ca screening: screening per urologist->most recent PSA was 1.08 on 02/18/21. Colon ca screening: next colonoscopy due 2024."  INTERIM HX: Adrian Cook is not doing too well from an emotional standpoint.  Unfortunately his older brother committed suicide about 2-1/2 weeks ago.  Adrian Cook is taking it quite hard understandably.  His brother was only 2 years older than him and has been very close.  He and his brother married sisters.  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  He is dealing with a lot of depressed mood and high anxiety.  After initial difficulty sleeping he has been able to sleep better last week or 2.  He has no suicidal or homicidal ideation.  He really does not have any significant history of major depression or significant pathologic anxiety.  His wife mentioned him the possibility of getting started on a medication for depression so he wanted to discuss this today. He does have the support of a tight,  loving family as well as a close [employer].  He plans on seeking grief counseling that is provided through his employer.  He says his bp was 134/78 at [facility] recently.  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   Chronic renal insufficiency, stage 2 (mild)    borderline II/III: nephrol eval 10/2020->suspected cause is combo of recurrent kidney stones, polycystic kidney dz, HTN, and DM.   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  No mass effect or enhancing lesions as of 2019 MRI.  Renal u/s kidneys about 14 cm bilat, multiple benign cysts, 2 kidney stones on L. Neph plans f/u CT 2025 to monitor prog of kidney size.   Constipation    Diabetes mellitus without complication (HCC) 10/2017   Dx'd at [facility] CPE   ED (erectile dysfunction)    Sildenafil trial by urol 05/2018.   Gallstones    asymptomatic   GERD (gastroesophageal reflux disease)    Dr. Dulce Sellar   Heart murmur    Found at age 51, w/u unrevealing.  No cardiac symptoms.   Kidney stones    [facility]: takes indapamide 1.25mg  qd +  K cit bid.   Metabolic stone dz: renal leak hypercalcuria.   Obesity, Class I, BMI 30-34.9    OSA (obstructive sleep apnea) approx 2008   was put on CPAP but says he fell out of the habit of using it due to "laziness".  Has CPAP machine but doesn't use it.   Osteoarthritis, multiple sites    Hands and right foot and both knees.  Ortho started meloxicam 08/2018.   Primary osteoarthritis of both knees    Vsco supplementation helpful in ths past.   Prostate cancer screening    Urol did DRE (wnl) and PSA 05/24/18. PSA 0.89 Nov 24, 2019 at [facility]. 02/18/21 PSA 1.1 at urol.   Simple hepatic cyst     many, tiny    Past Surgical History:  Procedure Laterality Date   ANTERIOR CRUCIATE LIGAMENT REPAIR  1991   ACL and PCL left knee   COLONOSCOPY  02/01/2018   Tubular adenoma, repeat 5 years    ETT  03/2015   NORMAL   US RENAL/AORTA     14cm bilaterally with multiple cysts mostly benign and 2 kideny stones on the left side. Repeat CT in 2025    Outpatient Medications Prior to Visit  Medication Sig Dispense Refill   atorvastatin (LIPITOR) 20 MG tablet TAKE 1 TABLET BY MOUTH  DAILY 90 tablet 1   indapamide (LOZOL) 2.5 MG tablet Take 2.5 mg by mouth daily.     metFORMIN (GLUCOPHAGE) 1000 MG tablet TAKE 1 TABLET BY MOUTH  TWICE DAILY WITH MEALS 180 tablet 0   omeprazole (PRILOSEC) 40 MG capsule TAKE 1 CAPSULE BY MOUTH  DAILY 90 capsule 1   pioglitazone (ACTOS) 30 MG tablet TAKE 1 TABLET BY MOUTH  DAILY 90 tablet 0   Potassium Citrate 15 MEQ (1620 MG) TBCR TAKE 1 TABLET BY MOUTH  TWICE DAILY 180 tablet 1   No facility-administered medications prior to visit.    Allergies  Allergen Reactions   Amoxicillin Rash   Ciprofloxacin Hcl Rash    ROS As per HPI  PE: Vitals with BMI 09/11/2021 03/20/2021 08/30/2020  Height 6\' 4"  6\' 4"  6' 5.5"  Weight 268 lbs 13 oz 267 lbs 6 oz 264 lbs  BMI 32.73 32.56 30.89  Systolic 146 117  Diastolic 79 78 88  Pulse 88 68 71   Gen: alert and well appearing.  Oriented x 4. Affect: calm, occasionally a bit tearful, displayed lucid thought and speech. CV: RRR, no m/r/g.   LUNGS: CTA bilat, nonlabored resps, good aeration in all lung fields. EXT: no clubbing or cyanosis.  no edema.    LABS:  Lab Results  Component Value Date   TSH 2.87 08/24/2019   Lab Results  Component Value Date   WBC 5.1 10/18/2020   HGB 14.2 10/18/2020   HCT 41 10/18/2020   MCV 89.4 08/24/2019   PLT 239 10/18/2020   Lab Results  Component Value Date   CREATININE 1.29 03/20/2021   BUN 25 (H) 03/20/2021   NA 139 03/20/2021   K 3.9 03/20/2021   CL 100 03/20/2021    CO2 29 03/20/2021   Lab Results  Component Value Date   ALT 21 03/20/2021   AST 17 03/20/2021   ALKPHOS 70 03/20/2021   BILITOT 1.2 03/20/2021   Lab Results  Component Value Date   CHOL 116 03/20/2021   Lab Results  Component Value Date   HDL 45.60 03/20/2021   Lab Results  Component Value Date  LDLCALC 55 03/20/2021   Lab Results  Component Value Date   TRIG 78.0 03/20/2021   Lab Results  Component Value Date   CHOLHDL 3 03/20/2021   Lab Results  Component Value Date   PSA 1.08 02/18/2021   PSA 0.89 11/24/2019   PSA 0.98 08/24/2019   Lab Results  Component Value Date   HGBA1C 7.0 (H) 03/20/2021   IMPRESSION AND PLAN:  #1 grief reaction: Tough to know what to do here.  We talked it over and touched on the fact that on one hand what he is going through his appropriate and normal but on another hand we do not know that antidepressants might possibly help.  Discussed the fact that antidepressants are typically very well-tolerated and have low risk of significant side effects if used appropriately.  We agreed today that I would send in a prescription for Lexapro 5 mg a day and he would talk it over more with his wife.  I described manic symptoms to him and told him if he has these should call right away and stopped medication.  Additionally I told him to stop medication and call us or seek emergency care if he starts to get worsening depression.  He will seek grief counseling offered to his employer.  #2 diabetes: Continue metformin 1000 mg twice a day.  Check hemoglobin A1c today.  3.  Hyperlipidemia continue atorvastatin 20 mg a day.  His LDL was 55 6 months ago.  Plan repeat lipid panel and hepatic panel in 6 months.  #4 chronic renal insufficiency stage II/III:  GFR baseline is low 60s. He is now off NSAIDs. BMET today.  An After Visit Summary was printed and given to the patient.  FOLLOW UP: Return in about 4 weeks (around 10/09/2021) for f/u  grief.  Signed:  Santiago Bumpers, MD           09/11/2021

## 2021-09-12 LAB — BASIC METABOLIC PANEL
BUN: 29 mg/dL — ABNORMAL HIGH (ref 6–23)
CO2: 30 mEq/L (ref 19–32)
Calcium: 9.9 mg/dL (ref 8.4–10.5)
Chloride: 102 mEq/L (ref 96–112)
Creatinine, Ser: 1.22 mg/dL (ref 0.40–1.50)
GFR: 67.35 mL/min (ref >60.00)
Glucose, Bld: 176 mg/dL — ABNORMAL HIGH (ref 70–99)
Potassium: 4.3 mEq/L (ref 3.5–5.1)
Sodium: 141 mEq/L (ref 135–145)

## 2021-09-12 LAB — HEMOGLOBIN A1C: Hgb A1c MFr Bld: 6.5 % (ref 4.6–6.5)

## 2021-09-20 IMAGING — US US RENAL
1 series · 13 of 25 positions shown · non-contrast
Comparison: Prior CT from 02/17/2020 and MRI from 05/25/2016.
comparison also made with prior renal ultrasound from 12/01/2019

CLINICAL DATA: Initial evaluation for chronic kidney disease stage
2, recurrent nephrolithiasis, multiple bilateral renal cysts.

EXAM:
RENAL / URINARY TRACT ULTRASOUND COMPLETE

[Series 1: us renal · 0.30mm/px · 13 of 77 slices shown]
[im 1/77]
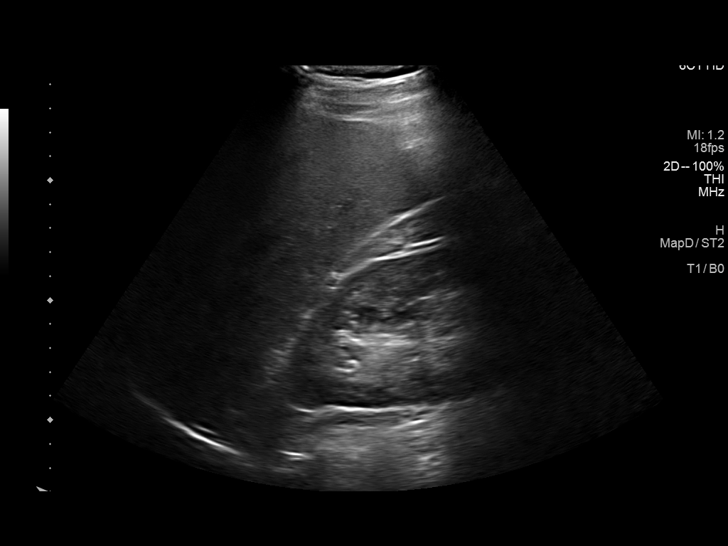
[im 7/77]
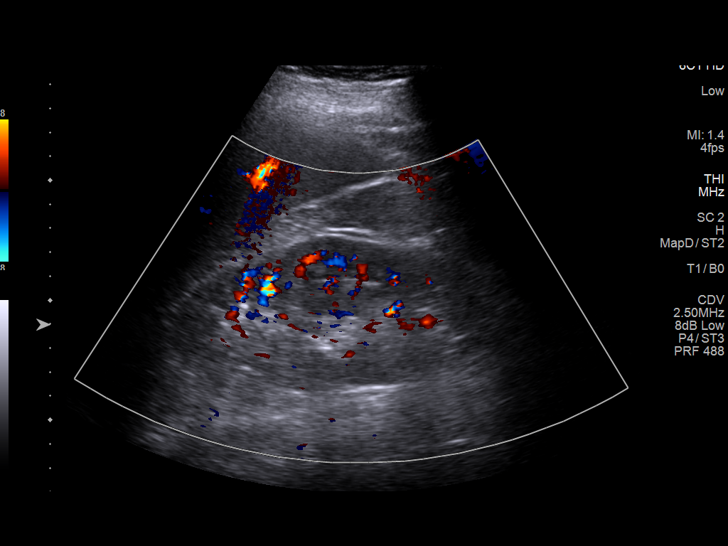
[im 13/77]
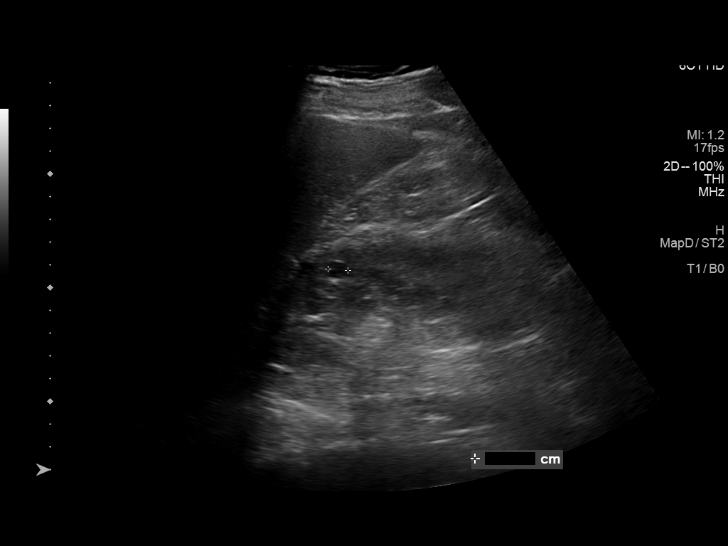
[im 20/77]
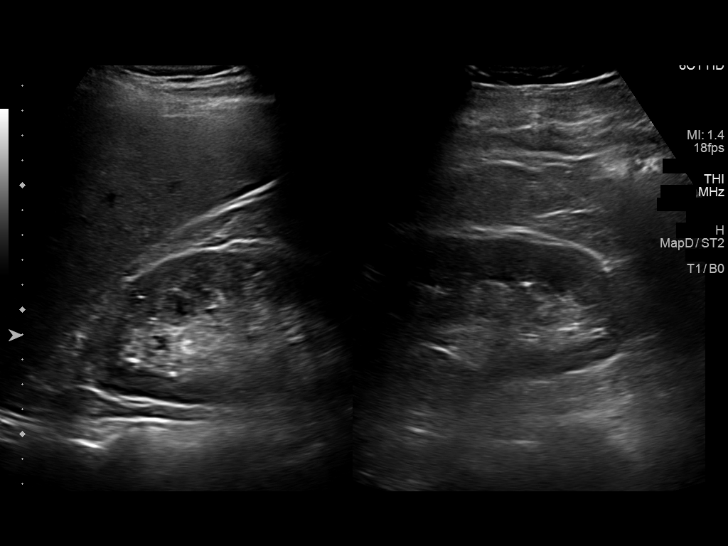
[im 26/77]
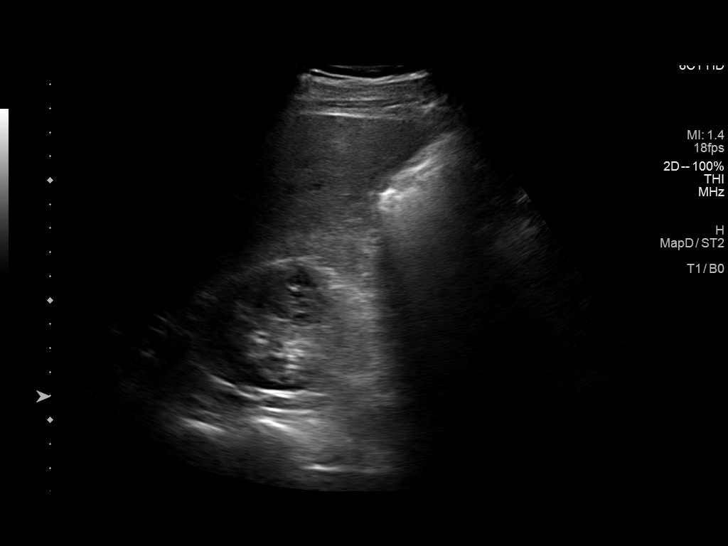
[im 32/77]
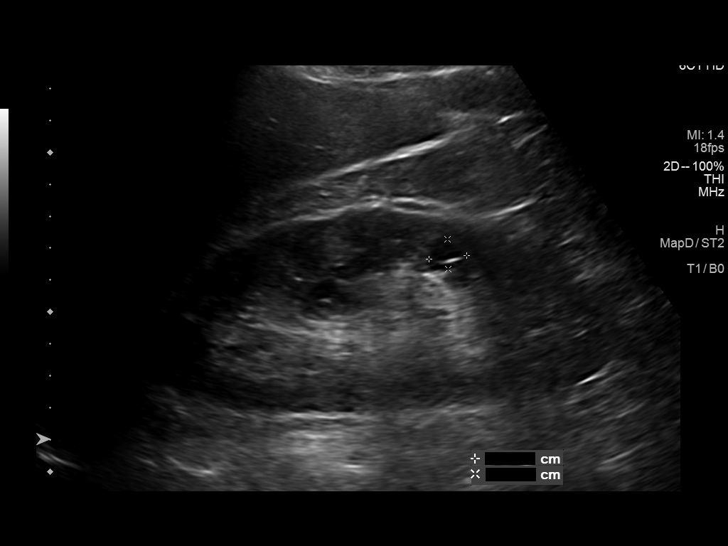
[im 39/77]
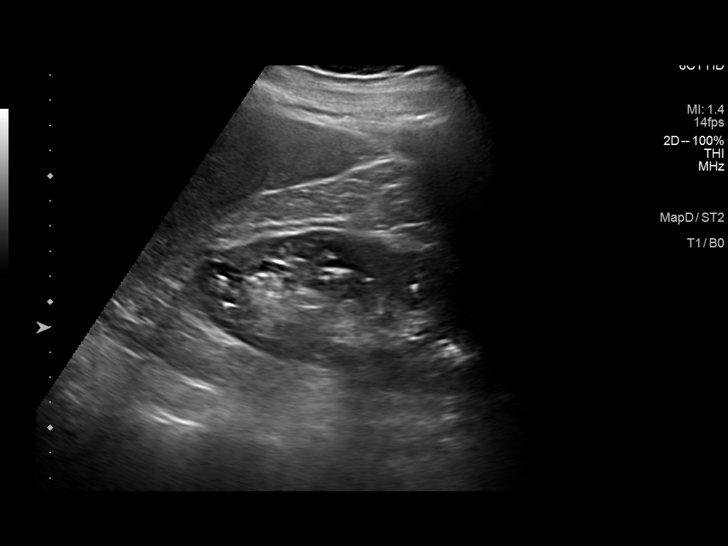
[im 45/77]
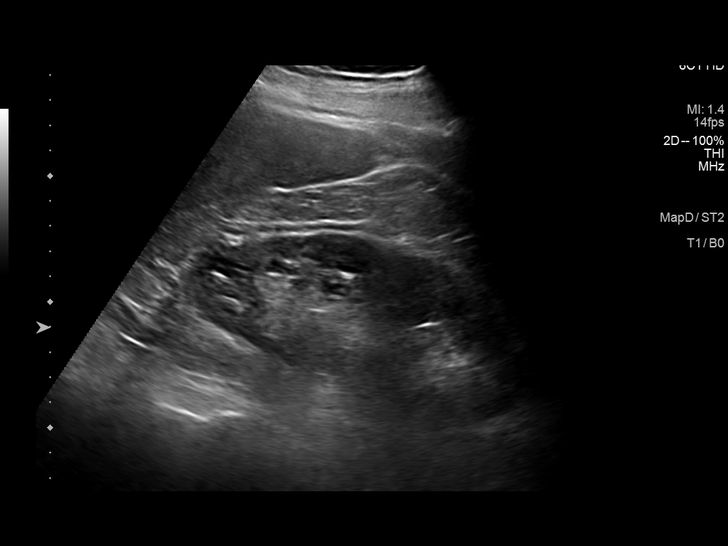
[im 51/77]
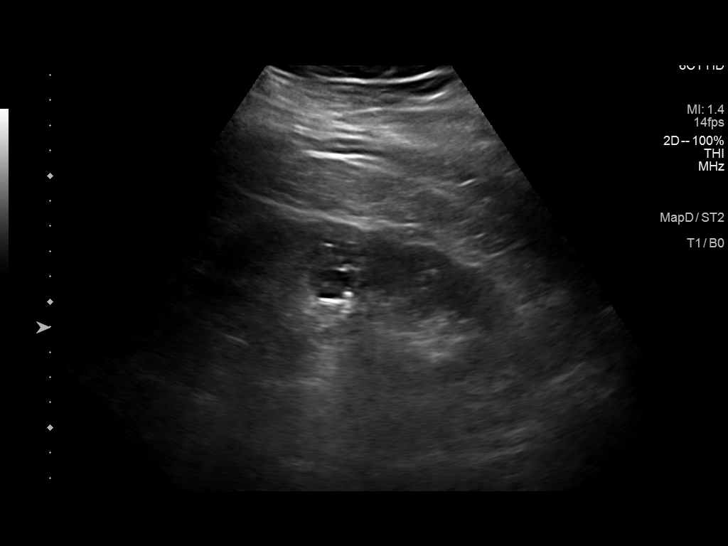
[im 58/77]
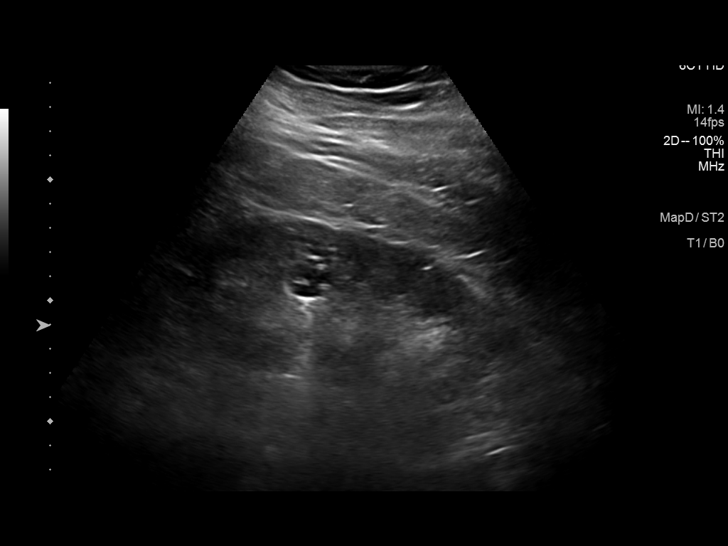
[im 64/77]
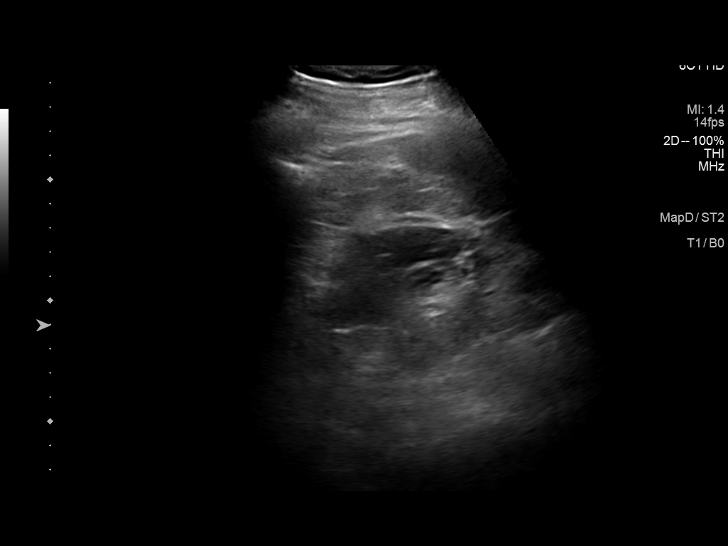
[im 70/77]
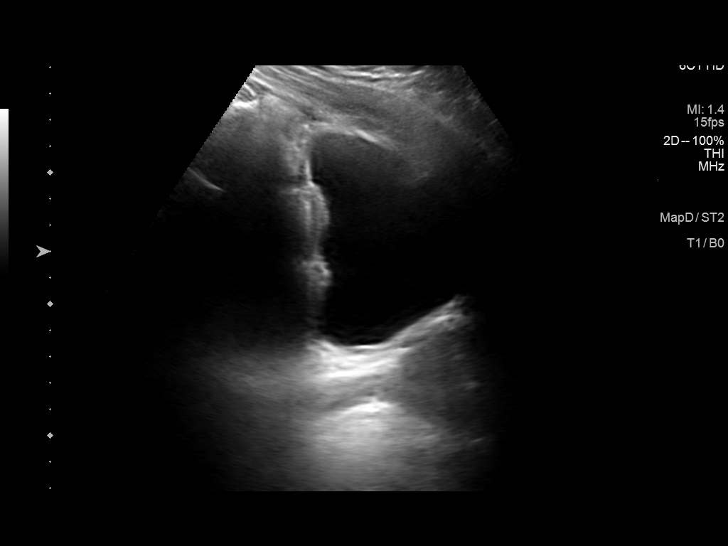
[im 77/77]
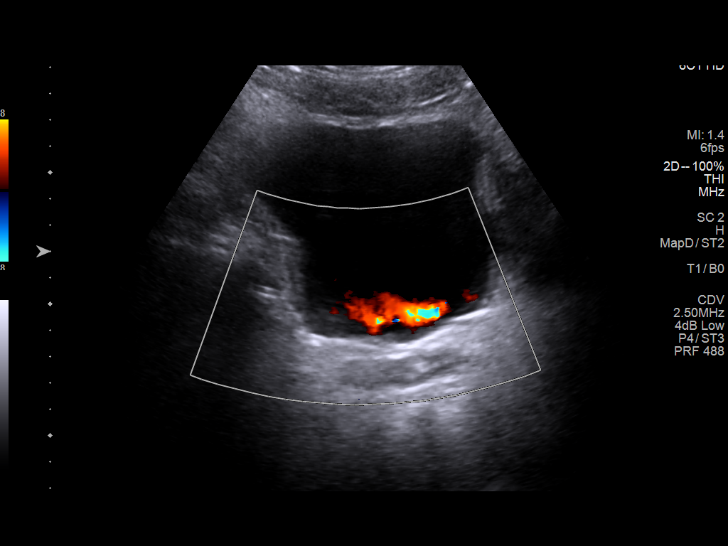

[13 of 25 positions shown; findings below may reference images not displayed]

FINDINGS: Right Kidney:

Renal measurements: 14.4 x 6.3 x 6.1 cm = volume: 289 mL. Renal
echogenicity within normal limits. No visible shadowing echogenic
calculi evident by sonography. 1.5 x 0.8 x 0.8 cm minimally complex
cyst with possible single thin internal septation noted at the upper
pole. Additional 1.2 x 0.9 x 1.5 cm mildly complex cyst with single
thin internal septation present at the lower pole. No vascularity or
concerning features. Few additional scattered subcentimeter simple
cyst noted as well.

Left Kidney:

Renal measurements: 14.7 x 5.8 x 5.4 cm = volume: 239 mL. Renal
echogenicity within normal limits. Two separate approximate 5 mm
echogenic shadowing nonobstructive calculi present at the lower
pole. No hydronephrosis. 1.2 x 0.7 x 1.1 cm minimally complex but
benign appearing cyst present at the upper pole. 1.3 x 1.1 x 1.3 cm
simple cyst also noted at the upper pole.

Bladder:

Appears normal for degree of bladder distention. Bilateral jets are
visualized.

Other:

None.
IMPRESSION: 1. Two separate 5 mm nonobstructive calculi at the lower pole the
left kidney. No hydronephrosis. No visible right-sided
nephrolithiasis by sonography.
2. Multiple scattered bilateral renal cysts as detailed above, a few
of which are mildly complex in appearance by sonography, but overall
demonstrate relatively benign features, and are likely not
significantly changed as compared to previous examinations.
3. Renal echogenicity within normal limits.

## 2021-09-22 ENCOUNTER — Ambulatory Visit: Payer: BC Managed Care – PPO | Admitting: Family Medicine

## 2021-10-03 ENCOUNTER — Other Ambulatory Visit: Payer: Self-pay | Admitting: Family Medicine

## 2021-10-17 ENCOUNTER — Ambulatory Visit: Payer: BC Managed Care – PPO | Admitting: Family Medicine

## 2021-10-17 ENCOUNTER — Other Ambulatory Visit: Payer: Self-pay

## 2021-10-17 ENCOUNTER — Encounter: Payer: Self-pay | Admitting: Family Medicine

## 2021-10-17 VITALS — BP 119/74 | HR 73 | Temp 98.4°F | Ht 76.0 in | Wt 279.4 lb

## 2021-10-17 DIAGNOSIS — F4321 Adjustment disorder with depressed mood: Secondary | ICD-10-CM

## 2021-10-17 NOTE — Progress Notes (Signed)
OFFICE VISIT  10/17/2021  CC:  Chief Complaint  Patient presents with   Follow-up    grief    HPI:    Patient is a 55 y.o. male who presents for 5 week f/u grief reaction. A/P as of last visit: "#1 grief reaction: Tough to know what to do here.  We talked it over and touched on the fact that on one hand what he is going through his appropriate and normal but on another hand we do not know that antidepressants might possibly help.  Discussed the fact that antidepressants are typically very well-tolerated and have low risk of significant side effects if used appropriately.  We agreed today that I would send in a prescription for Lexapro 5 mg a day and he would talk it over more with his wife.  I described manic symptoms to him and told him if he has these should call right away and stopped medication.  Additionally I told him to stop medication and call us or seek emergency care if he starts to get worsening depression.  He will seek grief counseling offered to his employer.   #2 diabetes: Continue metformin 1000 mg twice a day.  Check hemoglobin A1c today.  3.  Hyperlipidemia continue atorvastatin 20 mg a day.  His LDL was 55 6 months ago.  Plan repeat lipid panel and hepatic panel in 6 months.   #4 chronic renal insufficiency stage II/III:  GFR baseline is low 60s. He is now off NSAIDs. BMET today."  INTERIM HX: Adrian Cook is doing a bit better. He does say that he feels like he does not have much emotional response to anything since getting on the Lexapro.  Wife says he is quieter. He says his grieving gets gradually better every day.  He still thinks about his brother's death quite frequently but not constant as before.  He is sleeping fine.  He has talked with his pastor but never reached out to any counselor because he felt like this helped. He has the support for network of his wife and children and his brother's wife and children. He has no suicidal or homicidal ideation. 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   Chronic renal insufficiency, stage 2 (mild)    borderline II/III: nephrol eval 10/2020->suspected cause is combo of recurrent kidney stones, polycystic kidney dz, HTN, and DM.   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  No mass effect or enhancing lesions as of 2019 MRI.  Renal u/s kidneys about 14 cm bilat, multiple benign cysts, 2 kidney stones on L. Neph plans f/u CT 2025 to monitor prog of kidney size.   Constipation    Diabetes mellitus without complication (HCC) 10/2017   Dx'd at [facility] CPE   ED (erectile dysfunction)    Sildenafil trial by urol 05/2018.   Gallstones    asymptomatic   GERD (gastroesophageal reflux disease)    Dr. Dulce Sellar   Heart murmur    Found at age 26, w/u unrevealing.  No cardiac symptoms.   Kidney stones    [facility]: takes indapamide 1.25mg  qd + K cit bid.   Metabolic stone dz: renal  leak hypercalcuria.   Obesity, Class I, BMI 30-34.9    OSA (obstructive sleep apnea) approx 2008   was put on CPAP but says he fell out of the habit of using it due to "laziness".  Has CPAP machine but doesn't use it.   Osteoarthritis, multiple sites    Hands and right foot and both knees.  Ortho started meloxicam 08/2018.   Primary osteoarthritis of both knees    Vsco supplementation helpful in ths past.   Prostate cancer screening    Urol did DRE (wnl) and PSA 05/24/18. PSA 0.89 Nov 24, 2019 at [facility]. 02/18/21 PSA 1.1 at urol.   Simple hepatic cyst    many, tiny    Past Surgical History:  Procedure Laterality Date   ANTERIOR CRUCIATE LIGAMENT REPAIR  1991   ACL and PCL left knee   COLONOSCOPY   02/01/2018   Tubular adenoma, repeat 5 years    ETT  03/2015   NORMAL   US RENAL/AORTA     14cm bilaterally with multiple cysts mostly benign and 2 kideny stones on the left side. Repeat CT in 2025    Outpatient Medications Prior to Visit  Medication Sig Dispense Refill   atorvastatin (LIPITOR) 20 MG tablet TAKE 1 TABLET BY MOUTH  DAILY 90 tablet 1   indapamide (LOZOL) 2.5 MG tablet Take 2.5 mg by mouth daily.     metFORMIN (GLUCOPHAGE) 1000 MG tablet TAKE 1 TABLET BY MOUTH  TWICE DAILY WITH MEALS 180 tablet 0   omeprazole (PRILOSEC) 40 MG capsule TAKE 1 CAPSULE BY MOUTH  DAILY 90 capsule 1   pioglitazone (ACTOS) 30 MG tablet TAKE 1 TABLET BY MOUTH  DAILY 90 tablet 0   Potassium Citrate 15 MEQ (1620 MG) TBCR TAKE 1 TABLET BY MOUTH  TWICE DAILY 180 tablet 1   escitalopram (LEXAPRO) 5 MG tablet 1 tab po qd 30 tablet 1   No facility-administered medications prior to visit.    Allergies  Allergen Reactions   Amoxicillin Rash   Ciprofloxacin Hcl Rash    ROS As per HPI  PE: Vitals with BMI 10/17/2021 09/11/2021 03/20/2021  Height 6\' 4"  6\' 4"  6\' 4"   Weight 279 lbs 6 oz 268 lbs 13 oz 267 lbs 6 oz  BMI 34.02 [id number]  Systolic 123456 123456 [id number]  Diastolic 74 79 78  Pulse 73 88 68     Gen: Alert, well appearing.  Patient is oriented to person, place, time, and situation. AFFECT: pleasant, lucid thought and speech. No further exam today.  LABS:    Chemistry      Component Value Date/Time   NA 141 09/11/2021 1606   NA 139 01/28/2021 0000   K 4.3 09/11/2021 1606   CL 102 09/11/2021 1606   CO2 30 09/11/2021 1606   BUN 29 (H) 09/11/2021 1606   BUN 26 (A) 01/28/2021 0000   CREATININE 1.22 09/11/2021 1606   CREATININE 1.43 (H) 08/30/2020 1606   GLU 128 01/28/2021 0000      Component Value Date/Time   CALCIUM 9.9 09/11/2021 1606   ALKPHOS 70 03/20/2021 1555   AST 17 03/20/2021 1555   ALT 21 03/20/2021 1555   BILITOT 1.2 03/20/2021 1555     Lab Results  Component Value Date    WBC 5.1 10/18/2020   HGB 14.2 10/18/2020   HCT 41 10/18/2020   MCV 89.4 08/24/2019   PLT 239 10/18/2020   Lab Results  Component Value Date   TSH 2.87 08/24/2019  Lab Results  Component Value Date   HGBA1C 6.5 09/11/2021    IMPRESSION AND PLAN:  #1 grief reaction.  Emotional blunting on Lexapro 5 mg a day. We discussed this today and decided definitely to stop Lexapro and to not start any new medicine--let the grieving process go on without interference at this time.  He will call or return if he starts feeling any adverse effects of the discontinuation of Lexapro or in the future if he starts to feel worsening depression or anxiety. No labs needed.  An After Visit Summary was printed and given to the patient.  FOLLOW UP: Return in about 6 months (around 04/17/2022) for annual CPE (fasting).  Signed:  Crissie Sickles, MD           10/17/2021

## 2021-10-28 ENCOUNTER — Other Ambulatory Visit: Payer: Self-pay | Admitting: Family Medicine

## 2021-10-30 ENCOUNTER — Other Ambulatory Visit: Payer: Self-pay | Admitting: Family Medicine

## 2021-10-30 DIAGNOSIS — Z20822 Contact with and (suspected) exposure to covid-19: Secondary | ICD-10-CM | POA: Diagnosis not present

## 2021-10-31 ENCOUNTER — Telehealth: Payer: BC Managed Care – PPO | Admitting: Family

## 2021-10-31 DIAGNOSIS — U071 COVID-19: Secondary | ICD-10-CM | POA: Diagnosis not present

## 2021-10-31 MED ORDER — BENZONATATE 100 MG PO CAPS: 100.0000 mg | ORAL_CAPSULE | Freq: Three times a day (TID) | ORAL | 0 refills | Status: DC | PRN

## 2021-10-31 MED ORDER — MOLNUPIRAVIR EUA 200MG CAPSULE: 4.0000 | ORAL_CAPSULE | Freq: Two times a day (BID) | ORAL | 0 refills | Status: AC

## 2021-10-31 NOTE — Progress Notes (Signed)
Virtual Visit Consent   Adrian Cook, you are scheduled for a virtual visit with a [facility] provider today.     Just as with appointments in the office, your consent must be obtained to participate.  Your consent will be active for this visit and any virtual visit you may have with one of our providers in the next 365 days.     If you have a MyChart account, a copy of this consent can be sent to you electronically.  All virtual visits are billed to your insurance company just like a traditional visit in the office.    As this is a virtual visit, video technology does not allow for your provider to perform a traditional examination.  This may limit your provider's ability to fully assess your condition.  If your provider identifies any concerns that need to be evaluated in person or the need to arrange testing (such as labs, EKG, etc.), we will make arrangements to do so.     Although advances in technology are sophisticated, we cannot ensure that it will always work on either your end or our end.  If the connection with a video visit is poor, the visit may have to be switched to a telephone visit.  With either a video or telephone visit, we are not always able to ensure that we have a secure connection.     I need to obtain your verbal consent now.   Are you willing to proceed with your visit today?    Adrian Cook has provided verbal consent on 10/31/2021 for a virtual visit (video or telephone).   Jannifer Rodney, FNP   Date: 10/31/2021 12:44 PM   Virtual Visit via Video Note   I, Jannifer Rodney, connected with  Adrian Cook  ([id number], 1966/04/29) on 10/31/21 at 12:45 PM EST by a video-enabled telemedicine application and verified that I am speaking with the correct person using two identifiers.  Location: Patient: Virtual Visit Location Patient: Home Provider: Virtual Visit Location Provider: Home Office   I discussed the limitations of evaluation and management by  telemedicine and the availability of in person appointments. The patient expressed understanding and agreed to proceed.    History of Present Illness: Adrian Cook is a 55 y.o. who identifies as a male who was assigned male at birth, and is being seen today for tested positive for COVID yesterday. States his symptoms started yesterday. His wife and brother-in-law are COVID positive also.   HPI: Cough This is a new problem. The current episode started yesterday. The problem has been gradually worsening. The problem occurs every few minutes. The cough is Productive of sputum. Associated symptoms include chills, a fever, headaches, myalgias, nasal congestion and postnasal drip. Pertinent negatives include no ear congestion, ear pain, shortness of breath or wheezing. He has tried rest and OTC cough suppressant for the symptoms.   Problems:  Patient Active Problem List   Diagnosis Date Noted   Plantar fasciitis of left foot 06/11/2021   Shoulder pain, left 02/15/2020   Osteoarthritis of right knee 07/26/2018   Osteoarthritis of left knee 07/26/2018   Acute upper respiratory infection 03/19/2015   LUMBOSACRAL STRAIN 03/31/2009    Allergies:  Allergies  Allergen Reactions   Amoxicillin Rash   Ciprofloxacin Hcl Rash   Medications:  Current Outpatient Medications:    benzonatate (TESSALON PERLES) 100 MG capsule, Take 1 capsule (100 mg total) by mouth 3 (three) times daily as needed., Disp: 20 capsule,  Rfl: 0   molnupiravir EUA (LAGEVRIO) 200 mg CAPS capsule, Take 4 capsules (800 mg total) by mouth 2 (two) times daily for 5 days., Disp: 40 capsule, Rfl: 0   atorvastatin (LIPITOR) 20 MG tablet, TAKE 1 TABLET BY MOUTH  DAILY, Disp: 90 tablet, Rfl: 1   indapamide (LOZOL) 2.5 MG tablet, Take 2.5 mg by mouth daily., Disp: , Rfl:    metFORMIN (GLUCOPHAGE) 1000 MG tablet, TAKE 1 TABLET BY MOUTH  TWICE DAILY WITH MEALS, Disp: 180 tablet, Rfl: 1   omeprazole (PRILOSEC) 40 MG capsule, TAKE 1 CAPSULE BY  MOUTH  DAILY, Disp: 90 capsule, Rfl: 1   pioglitazone (ACTOS) 30 MG tablet, TAKE 1 TABLET BY MOUTH  DAILY, Disp: 90 tablet, Rfl: 0   Potassium Citrate 15 MEQ (1620 MG) TBCR, TAKE 1 TABLET BY MOUTH  TWICE DAILY, Disp: 180 tablet, Rfl: 1  Observations/Objective: Patient is well-developed, well-nourished in no acute distress.  Resting comfortably  at home.  Head is normocephalic, atraumatic.  No labored breathing.  Speech is clear and coherent with logical content.  Patient is alert and oriented at baseline.  Nasal congestion   Assessment and Plan: 1. COVID-19 - molnupiravir EUA (LAGEVRIO) 200 mg CAPS capsule; Take 4 capsules (800 mg total) by mouth 2 (two) times daily for 5 days.  Dispense: 40 capsule; Refill: 0 - benzonatate (TESSALON PERLES) 100 MG capsule; Take 1 capsule (100 mg total) by mouth 3 (three) times daily as needed.  Dispense: 20 capsule; Refill: 0 COVID positive, rest, force fluids, tylenol as needed, Quarantine for at least 5 days and you are fever free, then must wear a mask out in public from day 6-10, report any worsening symptoms such as increased shortness of breath, swelling, or continued high fevers. Possible adverse effects discussed with antivirals.    Follow Up Instructions: I discussed the assessment and treatment plan with the patient. The patient was provided an opportunity to ask questions and all were answered. The patient agreed with the plan and demonstrated an understanding of the instructions.  A copy of instructions were sent to the patient via MyChart unless otherwise noted below.     The patient was advised to call back or seek an in-person evaluation if the symptoms worsen or if the condition fails to improve as anticipated.  Time:  I spent 8 minutes with the patient via telehealth technology discussing the above problems/concerns.    Jannifer Rodney, FNP

## 2021-11-05 ENCOUNTER — Other Ambulatory Visit: Payer: Self-pay | Admitting: Family Medicine

## 2021-12-08 DIAGNOSIS — D3132 Benign neoplasm of left choroid: Secondary | ICD-10-CM | POA: Diagnosis not present

## 2022-02-07 ENCOUNTER — Other Ambulatory Visit: Payer: Self-pay | Admitting: Family Medicine

## 2022-04-17 ENCOUNTER — Encounter: Payer: Self-pay | Admitting: Family Medicine

## 2022-04-17 ENCOUNTER — Ambulatory Visit: Payer: BC Managed Care – PPO | Admitting: Family Medicine

## 2022-04-17 VITALS — BP 130/80 | HR 72 | Temp 98.0°F | Ht 76.0 in | Wt 279.4 lb

## 2022-04-17 DIAGNOSIS — N182 Chronic kidney disease, stage 2 (mild): Secondary | ICD-10-CM

## 2022-04-17 DIAGNOSIS — Z0000 Encounter for general adult medical examination without abnormal findings: Secondary | ICD-10-CM | POA: Diagnosis not present

## 2022-04-17 DIAGNOSIS — Z125 Encounter for screening for malignant neoplasm of prostate: Secondary | ICD-10-CM

## 2022-04-17 DIAGNOSIS — E1122 Type 2 diabetes mellitus with diabetic chronic kidney disease: Secondary | ICD-10-CM | POA: Diagnosis not present

## 2022-04-17 DIAGNOSIS — E7800 Pure hypercholesterolemia, unspecified: Secondary | ICD-10-CM | POA: Diagnosis not present

## 2022-04-17 DIAGNOSIS — Z23 Encounter for immunization: Secondary | ICD-10-CM

## 2022-04-17 MED ORDER — POTASSIUM CITRATE ER 15 MEQ (1620 MG) PO TBCR: 1.0000 | EXTENDED_RELEASE_TABLET | Freq: Two times a day (BID) | ORAL | 3 refills | Status: DC

## 2022-04-17 MED ORDER — PIOGLITAZONE HCL 30 MG PO TABS: 30.0000 mg | ORAL_TABLET | Freq: Every day | ORAL | 3 refills | Status: DC

## 2022-04-17 MED ORDER — METFORMIN HCL 1000 MG PO TABS
1000.0000 mg | ORAL_TABLET | Freq: Two times a day (BID) | ORAL | 3 refills | Status: DC
Start: 2022-04-17 — End: 2023-03-24

## 2022-04-17 MED ORDER — ATORVASTATIN CALCIUM 20 MG PO TABS: 20.0000 mg | ORAL_TABLET | Freq: Every day | ORAL | 3 refills | Status: DC

## 2022-04-17 MED ORDER — OMEPRAZOLE 40 MG PO CPDR
40.0000 mg | DELAYED_RELEASE_CAPSULE | Freq: Every day | ORAL | 3 refills | Status: DC
Start: 2022-04-17 — End: 2023-03-24

## 2022-04-17 NOTE — Patient Instructions (Signed)
Health Maintenance, Male Adopting a healthy lifestyle and getting preventive care are important in promoting health and wellness. Ask your health care provider about: The right schedule for you to have regular tests and exams. Things you can do on your own to prevent diseases and keep yourself healthy. What should I know about diet, weight, and exercise? Eat a healthy diet  Eat a diet that includes plenty of vegetables, fruits, low-fat dairy products, and lean protein. Do not eat a lot of foods that are high in solid fats, added sugars, or sodium. Maintain a healthy weight Body mass index (BMI) is a measurement that can be used to identify possible weight problems. It estimates body fat based on height and weight. Your health care provider can help determine your BMI and help you achieve or maintain a healthy weight. Get regular exercise Get regular exercise. This is one of the most important things you can do for your health. Most adults should: Exercise for at least 150 minutes each week. The exercise should increase your heart rate and make you sweat (moderate-intensity exercise). Do strengthening exercises at least twice a week. This is in addition to the moderate-intensity exercise. Spend less time sitting. Even light physical activity can be beneficial. Watch cholesterol and blood lipids Have your blood tested for lipids and cholesterol at 55 years of age, then have this test every 5 years. You may need to have your cholesterol levels checked more often if: Your lipid or cholesterol levels are high. You are older than 55 years of age. You are at high risk for heart disease. What should I know about cancer screening? Many types of cancers can be detected early and may often be prevented. Depending on your health history and family history, you may need to have cancer screening at various ages. This may include screening for: Colorectal cancer. Prostate cancer. Skin cancer. Lung  cancer. What should I know about heart disease, diabetes, and high blood pressure? Blood pressure and heart disease High blood pressure causes heart disease and increases the risk of stroke. This is more likely to develop in people who have high blood pressure readings or are overweight. Talk with your health care provider about your target blood pressure readings. Have your blood pressure checked: Every 3-5 years if you are 18-39 years of age. Every year if you are 40 years old or older. If you are between the ages of 65 and 75 and are a current or former smoker, ask your health care provider if you should have a one-time screening for abdominal aortic aneurysm (AAA). Diabetes Have regular diabetes screenings. This checks your fasting blood sugar level. Have the screening done: Once every three years after age 45 if you are at a normal weight and have a low risk for diabetes. More often and at a younger age if you are overweight or have a high risk for diabetes. What should I know about preventing infection? Hepatitis B If you have a higher risk for hepatitis B, you should be screened for this virus. Talk with your health care provider to find out if you are at risk for hepatitis B infection. Hepatitis C Blood testing is recommended for: Everyone born from 1945 through 1965. Anyone with known risk factors for hepatitis C. Sexually transmitted infections (STIs) You should be screened each year for STIs, including gonorrhea and chlamydia, if: You are sexually active and are younger than 55 years of age. You are older than 55 years of age and your   health care provider tells you that you are at risk for this type of infection. Your sexual activity has changed since you were last screened, and you are at increased risk for chlamydia or gonorrhea. Ask your health care provider if you are at risk. Ask your health care provider about whether you are at high risk for HIV. Your health care provider  may recommend a prescription medicine to help prevent HIV infection. If you choose to take medicine to prevent HIV, you should first get tested for HIV. You should then be tested every 3 months for as long as you are taking the medicine. Follow these instructions at home: Alcohol use Do not drink alcohol if your health care provider tells you not to drink. If you drink alcohol: Limit how much you have to 0-2 drinks a day. Know how much alcohol is in your drink. In the [place] one drink equals one 12 oz bottle of beer (355 mL), one 5 oz glass of wine (148 mL), or one 1 oz glass of hard liquor (44 mL). Lifestyle Do not use any products that contain nicotine or tobacco. These products include cigarettes, chewing tobacco, and vaping devices, such as e-cigarettes. If you need help quitting, ask your health care provider. Do not use street drugs. Do not share needles. Ask your health care provider for help if you need support or information about quitting drugs. General instructions Schedule regular health, dental, and eye exams. Stay current with your vaccines. Tell your health care provider if: You often feel depressed. You have ever been abused or do not feel safe at home. Summary Adopting a healthy lifestyle and getting preventive care are important in promoting health and wellness. Follow your health care provider's instructions about healthy diet, exercising, and getting tested or screened for diseases. Follow your health care provider's instructions on monitoring your cholesterol and blood pressure. This information is not intended to replace advice given to you by your health care provider. Make sure you discuss any questions you have with your health care provider. Document Revised: 03/24/2021 Document Reviewed: 03/24/2021 Elsevier Patient Education  2023 [employer]  

## 2022-04-17 NOTE — Progress Notes (Signed)
Office Note 04/17/2022  CC:  Chief Complaint  Patient presents with   Diabetes   Hypertension   Hyperlipidemia    Pt is fasting    HPI:  Patient is a 56 y.o. male who is here for annual health maintenance exam and 32-month follow-up diabetes, hyperlipidemia, and chronic renal insufficiency II/III.  FEET: Some intermittent feeling of tingling in discomfort in the distal aspect of the feet left greater than right.  This usually occurs after he is been up on concrete all through his day.  It does not occur every day  He has some mild constant low back pain and bilateral knee pain.  No swelling of the joints.  No home glucose monitoring.  He does take metformin 1000 mg twice a day.  He feels some mild chronic fatigue.  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   Chronic renal insufficiency, stage 2 (mild)    borderline II/III: nephrol eval 10/2020->suspected cause is combo of recurrent kidney stones, polycystic kidney dz, HTN, and DM.   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  No mass effect or enhancing lesions as of 2019 MRI.  Renal u/s kidneys about 14 cm bilat, multiple benign cysts, 2 kidney stones on L. Neph plans f/u CT 2025 to monitor prog of kidney size.   Constipation    Diabetes mellitus without complication ([place]) 0000000   Dx'd at [employer] CPE   ED (erectile dysfunction)    Sildenafil trial by urol 05/2018.   Gallstones    asymptomatic   GERD (gastroesophageal reflux disease)    Dr. Paulita Fujita   Heart murmur    Found at age 63, w/u unrevealing.  No cardiac symptoms.   Kidney stones    [facility]: takes indapamide 1.25mg  qd + K cit bid.   Metabolic stone dz: renal  leak hypercalcuria.   Obesity, Class I, BMI 30-34.9    OSA (obstructive sleep apnea) approx 2008   was put on CPAP but says he fell out of the habit of using it due to "laziness".  Has CPAP machine but doesn't use it.   Osteoarthritis, multiple sites    Hands and right foot and both knees.  Ortho started meloxicam 08/2018.   Primary osteoarthritis of both knees    Vsco supplementation helpful in ths past.   Prostate cancer screening    Urol did DRE (wnl) and PSA 05/24/18. PSA 0.89 Nov 24, 2019 at [facility]. 02/18/21 PSA 1.1 at urol.   Simple hepatic cyst    many, tiny    Past Surgical History:  Procedure Laterality Date   ANTERIOR CRUCIATE LIGAMENT REPAIR  1991   ACL and PCL left knee   COLONOSCOPY  02/01/2018   Tubular adenoma, repeat 5 years    ETT  03/2015   NORMAL   [facility] RENAL/AORTA     14cm bilaterally with multiple cysts mostly benign and 2 kideny stones on the left side. Repeat CT in 2025    Family History  Problem Relation Age of Onset   Osteoarthritis Mother    Arthritis Mother    Heart failure Father    Leukemia Maternal Grandfather    Colon cancer Neg Hx    Prostate cancer Neg  Hx     Social History   Socioeconomic History   Marital status: Married    Spouse name: Not on file   Number of children: Not on file   Years of education: Not on file   Highest education level: Not on file  Occupational History   Not on file  Tobacco Use   Smoking status: Never   Smokeless tobacco: Never  Vaping Use   Vaping Use: Never used  Substance and Sexual Activity   Alcohol use: No    Alcohol/week: 0.0 standard drinks   Drug use: No   Sexual activity: Not on file  Other Topics Concern   Not on file  Social History Narrative   Married, no children.   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   No T/A/Ds.   Social Determinants of Health   Financial Resource Strain: Not on file  Food Insecurity: Not on file  Transportation Needs: Not on file   Physical Activity: Not on file  Stress: Not on file  Social Connections: Not on file  Intimate Partner Violence: Not on file    Outpatient Medications Prior to Visit  Medication Sig Dispense Refill   indapamide (LOZOL) 2.5 MG tablet Take 2.5 mg by mouth daily.     atorvastatin (LIPITOR) 20 MG tablet TAKE 1 TABLET BY MOUTH  DAILY 90 tablet 1   benzonatate (TESSALON PERLES) 100 MG capsule Take 1 capsule (100 mg total) by mouth 3 (three) times daily as needed. 20 capsule 0   metFORMIN (GLUCOPHAGE) 1000 MG tablet TAKE 1 TABLET BY MOUTH  TWICE DAILY WITH MEALS 180 tablet 1   omeprazole (PRILOSEC) 40 MG capsule TAKE 1 CAPSULE BY MOUTH  DAILY 90 capsule 0   pioglitazone (ACTOS) 30 MG tablet TAKE 1 TABLET BY MOUTH  DAILY 90 tablet 1   Potassium Citrate 15 MEQ (1620 MG) TBCR TAKE 1 TABLET BY MOUTH  TWICE DAILY 180 tablet 1   No facility-administered medications prior to visit.    Allergies  Allergen Reactions   Amoxicillin Rash   Ciprofloxacin Hcl Rash    ROS Review of Systems  Constitutional:  Negative for appetite change, chills, fatigue and fever.  HENT:  Negative for congestion, dental problem, ear pain and sore throat.   Eyes:  Negative for discharge, redness and visual disturbance.  Respiratory:  Negative for cough, chest tightness, shortness of breath and wheezing.   Cardiovascular:  Negative for chest pain, palpitations and leg swelling.  Gastrointestinal:  Negative for abdominal pain, blood in stool, diarrhea, nausea and vomiting.  Genitourinary:  Negative for difficulty urinating, dysuria, flank pain, frequency, hematuria and urgency.  Musculoskeletal:  Negative for arthralgias, back pain, joint swelling, myalgias and neck stiffness.  Skin:  Negative for pallor and rash.  Neurological:  Negative for dizziness, speech difficulty, weakness and headaches.  Hematological:  Negative for adenopathy. Does not bruise/bleed easily.  Psychiatric/Behavioral:  Negative for confusion and  sleep disturbance. The patient is not nervous/anxious.    PE;    04/17/2022    3:28 PM 10/17/2021    3:26 PM 09/11/2021    3:33 PM  Vitals with BMI  Height 6\' 4"  6\' 4"  6\' 4"   Weight 279 lbs 6 oz 279 lbs 6 oz 268 lbs 13 oz  BMI 34.02 [id number]  Systolic [id number]  Diastolic 80 74 79  Pulse 72 73 88    Gen: Alert, well appearing.  Patient is oriented to person, place,  time, and situation. AFFECT: pleasant, lucid thought and speech. ENT: Ears: EACs clear, normal epithelium.  TMs with good light reflex and landmarks bilaterally.  Eyes: no injection, icteris, swelling, or exudate.  EOMI, PERRLA. Nose: no drainage or turbinate edema/swelling.  No injection or focal lesion.  Mouth: lips without lesion/swelling.  Oral mucosa pink and moist.  Dentition intact and without obvious caries or gingival swelling.  Oropharynx without erythema, exudate, or swelling.  Neck: supple/nontender.  No LAD, mass, or TM.  Carotid pulses 2+ bilaterally, without bruits. CV: RRR, no m/r/g.   LUNGS: CTA bilat, nonlabored resps, good aeration in all lung fields. ABD: soft, NT, ND, BS normal.  No hepatospenomegaly or mass.  No bruits. EXT: no clubbing, cyanosis, or edema.  Musculoskeletal: no joint swelling, erythema, warmth, or tenderness.  ROM of all joints intact. Skin - no sores or suspicious lesions or rashes or color changes  Pertinent labs:  Lab Results  Component Value Date   TSH 2.87 08/24/2019   Lab Results  Component Value Date   WBC 5.1 10/18/2020   HGB 14.2 10/18/2020   HCT 41 10/18/2020   MCV 89.4 08/24/2019   PLT 239 10/18/2020   Lab Results  Component Value Date   CREATININE 1.22 09/11/2021   BUN 29 (H) 09/11/2021   NA 141 09/11/2021   K 4.3 09/11/2021   CL 102 09/11/2021   CO2 30 09/11/2021   Lab Results  Component Value Date   ALT 21 03/20/2021   AST 17 03/20/2021   ALKPHOS 70 03/20/2021   BILITOT 1.2 03/20/2021   Lab Results  Component Value Date   CHOL 116 03/20/2021    Lab Results  Component Value Date   HDL 45.60 03/20/2021   Lab Results  Component Value Date   LDLCALC 55 03/20/2021   Lab Results  Component Value Date   TRIG 78.0 03/20/2021   Lab Results  Component Value Date   CHOLHDL 3 03/20/2021   Lab Results  Component Value Date   PSA 1.08 02/18/2021   PSA 0.89 11/24/2019   PSA 0.98 08/24/2019   Lab Results  Component Value Date   HGBA1C 6.5 09/11/2021   ASSESSMENT AND PLAN:   #1 type 2 diabetes with nephropathy. Continue metformin 1000 twice daily. Hemoglobin A1c and urine microalbumin/creatinine today.  2.  Hyperlipidemia.  Continue atorvastatin 20 mg/day. Fasting lipid panel and hepatic panel today.  3.  Chronic renal insufficiency stage II/III. He no longer takes any NSAIDs.  He hydrates very well. Electrolytes and creatinine today.  4. Health maintenance exam: Reviewed age and gender appropriate health maintenance issues (prudent diet, regular exercise, health risks of tobacco and excessive alcohol, use of seatbelts, fire alarms in home, use of sunscreen).  Also reviewed age and gender appropriate health screening as well as vaccine recommendations. Vaccines: Prevnar 20->given today.  O/W UTD. Labs: HP labs + Hba1c Prostate ca screening: PSA ordered Colon ca screening: recall 01/2023  An After Visit Summary was printed and given to the patient.  FOLLOW UP:  Return in about 6 months (around 10/17/2022) for routine chronic illness f/u.  Signed:  Crissie Sickles, MD           04/17/2022

## 2022-04-17 NOTE — Addendum Note (Signed)
Addended by: Emi Holes D on: 04/17/2022 03:59 PM   Modules accepted: Orders

## 2022-04-18 LAB — LIPID PANEL
Cholesterol: 125 mg/dL (ref <200)
HDL: 50 mg/dL (ref >=40)
LDL Cholesterol (Calc): 58 mg/dL (calc)
Non-HDL Cholesterol (Calc): 75 mg/dL (calc) (ref <130)
Total CHOL/HDL Ratio: 2.5 (calc) (ref <5.0)
Triglycerides: 88 mg/dL (ref <150)

## 2022-04-18 LAB — CBC WITH DIFFERENTIAL/PLATELET
Absolute Monocytes: 359 cells/uL (ref 200–950)
Basophils Absolute: 29 cells/uL (ref 0–200)
Basophils Relative: 0.5 %
Eosinophils Absolute: 211 cells/uL (ref 15–500)
Eosinophils Relative: 3.7 %
HCT: 40.9 % (ref 38.5–50.0)
Hemoglobin: 14.3 g/dL (ref 13.2–17.1)
Lymphs Abs: 1613 cells/uL (ref 850–3900)
MCH: 30.6 pg (ref 27.0–33.0)
MCHC: 35 g/dL (ref 32.0–36.0)
MCV: 87.4 fL (ref 80.0–100.0)
MPV: 10.1 fL (ref 7.5–12.5)
Monocytes Relative: 6.3 %
Neutro Abs: 3488 cells/uL (ref 1500–7800)
Neutrophils Relative %: 61.2 %
Platelets: 253 10*3/uL (ref 140–400)
RBC: 4.68 10*6/uL (ref 4.20–5.80)
RDW: 12.3 % (ref 11.0–15.0)
Total Lymphocyte: 28.3 %
WBC: 5.7 10*3/uL (ref 3.8–10.8)

## 2022-04-18 LAB — COMPREHENSIVE METABOLIC PANEL
AG Ratio: 1.9 (calc) (ref 1.0–2.5)
ALT: 19 U/L (ref 9–46)
AST: 20 U/L (ref 10–35)
Albumin: 4.7 g/dL (ref 3.6–5.1)
Alkaline phosphatase (APISO): 64 U/L (ref 35–144)
BUN/Creatinine Ratio: 20 (calc) (ref 6–22)
BUN: 27 mg/dL — ABNORMAL HIGH (ref 7–25)
CO2: 19 mmol/L — ABNORMAL LOW (ref 20–32)
Calcium: 10.1 mg/dL (ref 8.6–10.3)
Chloride: 103 mmol/L (ref 98–110)
Creat: 1.36 mg/dL — ABNORMAL HIGH (ref 0.70–1.30)
Globulin: 2.5 g/dL (calc) (ref 1.9–3.7)
Glucose, Bld: 94 mg/dL (ref 65–99)
Potassium: 4.1 mmol/L (ref 3.5–5.3)
Sodium: 142 mmol/L (ref 135–146)
Total Bilirubin: 0.9 mg/dL (ref 0.2–1.2)
Total Protein: 7.2 g/dL (ref 6.1–8.1)

## 2022-04-18 LAB — HEMOGLOBIN A1C
Hgb A1c MFr Bld: 7.2 % of total Hgb — ABNORMAL HIGH (ref <5.7)
Mean Plasma Glucose: 160 mg/dL
eAG (mmol/L): 8.9 mmol/L

## 2022-04-18 LAB — TSH: TSH: 2.18 mIU/L (ref 0.40–4.50)

## 2022-04-18 LAB — PSA: PSA: 1.04 ng/mL (ref <=4.00)

## 2022-04-18 LAB — MICROALBUMIN / CREATININE URINE RATIO
Creatinine, Urine: 72 mg/dL (ref 20–320)
Microalb Creat Ratio: 13 mcg/mg creat (ref <30)
Microalb, Ur: 0.9 mg/dL

## 2022-04-21 ENCOUNTER — Telehealth: Payer: Self-pay

## 2022-04-21 MED ORDER — PIOGLITAZONE HCL 45 MG PO TABS: 45.0000 mg | ORAL_TABLET | Freq: Every day | ORAL | 1 refills | Status: DC

## 2022-04-21 NOTE — Telephone Encounter (Signed)
-----   Message from Tammi Sou, MD sent at 04/20/2022  8:35 AM EDT ----- Hemoglobin A1c up to 7.2% compared to 6.5% last check. Lets increase pioglitazone to 45 mg a day.  Please do 90-day prescription, refill x1. All other labs normal.  Keep up the good work.

## 2022-08-19 DIAGNOSIS — I10 Essential (primary) hypertension: Secondary | ICD-10-CM | POA: Diagnosis not present

## 2022-08-19 DIAGNOSIS — N182 Chronic kidney disease, stage 2 (mild): Secondary | ICD-10-CM | POA: Diagnosis not present

## 2022-08-19 DIAGNOSIS — N200 Calculus of kidney: Secondary | ICD-10-CM | POA: Diagnosis not present

## 2022-08-19 DIAGNOSIS — Q6102 Congenital multiple renal cysts: Secondary | ICD-10-CM | POA: Diagnosis not present

## 2022-08-20 LAB — COMPREHENSIVE METABOLIC PANEL
Albumin: 4.5 (ref 3.5–5.0)
Calcium: 9.3 (ref 8.7–10.7)
eGFR: 51

## 2022-08-20 LAB — BASIC METABOLIC PANEL
BUN: 22 — AB (ref 4–21)
CO2: 25 — AB (ref 13–22)
Chloride: 100 (ref 99–108)
Creatinine: 1.6 — AB (ref <=1.3)
Glucose: 184
Potassium: 3.9 mEq/L (ref 3.5–5.1)
Sodium: 139 (ref 137–147)

## 2022-08-20 LAB — CBC AND DIFFERENTIAL: Hemoglobin: 14 (ref 13.5–17.5)

## 2022-09-03 DIAGNOSIS — N182 Chronic kidney disease, stage 2 (mild): Secondary | ICD-10-CM | POA: Diagnosis not present

## 2022-09-14 DIAGNOSIS — D3132 Benign neoplasm of left choroid: Secondary | ICD-10-CM | POA: Diagnosis not present

## 2022-09-14 DIAGNOSIS — D3131 Benign neoplasm of right choroid: Secondary | ICD-10-CM | POA: Diagnosis not present

## 2022-09-14 DIAGNOSIS — E119 Type 2 diabetes mellitus without complications: Secondary | ICD-10-CM | POA: Diagnosis not present

## 2022-09-14 LAB — HM DIABETES EYE EXAM

## 2022-10-06 ENCOUNTER — Other Ambulatory Visit: Payer: Self-pay | Admitting: Family Medicine

## 2022-10-16 ENCOUNTER — Ambulatory Visit: Payer: BC Managed Care – PPO | Admitting: Family Medicine

## 2022-10-27 DIAGNOSIS — Z125 Encounter for screening for malignant neoplasm of prostate: Secondary | ICD-10-CM | POA: Diagnosis not present

## 2022-10-27 DIAGNOSIS — N200 Calculus of kidney: Secondary | ICD-10-CM | POA: Diagnosis not present

## 2022-10-30 ENCOUNTER — Encounter: Payer: Self-pay | Admitting: Family Medicine

## 2022-10-30 ENCOUNTER — Ambulatory Visit: Payer: BC Managed Care – PPO | Admitting: Family Medicine

## 2022-10-30 VITALS — BP 130/80 | HR 71 | Temp 97.6°F | Ht 76.0 in | Wt 286.6 lb

## 2022-10-30 DIAGNOSIS — M545 Low back pain, unspecified: Secondary | ICD-10-CM

## 2022-10-30 DIAGNOSIS — Z23 Encounter for immunization: Secondary | ICD-10-CM | POA: Diagnosis not present

## 2022-10-30 DIAGNOSIS — E7800 Pure hypercholesterolemia, unspecified: Secondary | ICD-10-CM | POA: Diagnosis not present

## 2022-10-30 DIAGNOSIS — N1831 Chronic kidney disease, stage 3a: Secondary | ICD-10-CM

## 2022-10-30 DIAGNOSIS — E119 Type 2 diabetes mellitus without complications: Secondary | ICD-10-CM | POA: Diagnosis not present

## 2022-10-30 DIAGNOSIS — G8929 Other chronic pain: Secondary | ICD-10-CM | POA: Diagnosis not present

## 2022-10-30 LAB — HEMOGLOBIN A1C: Hgb A1c MFr Bld: 7.9 % — ABNORMAL HIGH (ref 4.6–6.5)

## 2022-10-30 LAB — BASIC METABOLIC PANEL
BUN: 23 mg/dL (ref 6–23)
CO2: 32 mEq/L (ref 19–32)
Calcium: 9.7 mg/dL (ref 8.4–10.5)
Chloride: 100 mEq/L (ref 96–112)
Creatinine, Ser: 1.16 mg/dL (ref 0.40–1.50)
GFR: 70.98 mL/min (ref >60.00)
Glucose, Bld: 152 mg/dL — ABNORMAL HIGH (ref 70–99)
Potassium: 4.3 mEq/L (ref 3.5–5.1)
Sodium: 140 mEq/L (ref 135–145)

## 2022-10-30 LAB — LIPID PANEL
Cholesterol: 113 mg/dL (ref 0–200)
HDL: 42.1 mg/dL (ref >39.00)
LDL Cholesterol: 46 mg/dL (ref 0–99)
NonHDL: 70.49
Total CHOL/HDL Ratio: 3
Triglycerides: 123 mg/dL (ref 0.0–149.0)
VLDL: 24.6 mg/dL (ref 0.0–40.0)

## 2022-10-30 MED ORDER — PIOGLITAZONE HCL 45 MG PO TABS: 45.0000 mg | ORAL_TABLET | Freq: Every day | ORAL | 3 refills | Status: DC

## 2022-10-30 MED ORDER — TRIAMCINOLONE ACETONIDE 40 MG/ML IJ SUSP: 40.0000 mg | Freq: Once | INTRAMUSCULAR | Status: DC

## 2022-10-30 MED ORDER — TRIAMCINOLONE ACETONIDE 40 MG/ML IJ SUSP
40.0000 mg | Freq: Once | INTRAMUSCULAR | Status: AC
  Administered 2022-10-30: 40 mg via INTRA_ARTICULAR

## 2022-10-30 NOTE — Progress Notes (Signed)
OFFICE VISIT  10/30/2022  CC:  Chief Complaint  Patient presents with   Diabetes   Hyperlipidemia    Pt is fasting   Chronic Kidney Disease   Patient is a 56 y.o. male who presents for 86-monthfollow-up diabetes, hyperlipidemia, and chronic renal insufficiency. A/P as of last visit: "#1 type 2 diabetes with nephropathy. Continue metformin 1000 twice daily. Hemoglobin A1c and urine microalbumin/creatinine today.  2.  Hyperlipidemia.  Continue atorvastatin 20 mg/day. Fasting lipid panel and hepatic panel today.  3.  Chronic renal insufficiency stage II/III. He no longer takes any NSAIDs.  He hydrates very well. Electrolytes and creatinine today.   4. Health maintenance exam: Reviewed age and gender appropriate health maintenance issues (prudent diet, regular exercise, health risks of tobacco and excessive alcohol, use of seatbelts, fire alarms in home, use of sunscreen).  Also reviewed age and gender appropriate health screening as well as vaccine recommendations. Vaccines: Prevnar 20->given today.  O/W UTD. Labs: HP labs + Hba1c Prostate ca screening: PSA ordered Colon ca screening: recall 01/2023"  INTERIM HX: Last visit his hemoglobin A1c was up to 7.2% so we increased his pioglitazone to 45 mg/day. All other labs excellent/stable.  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 Since I last saw him his nephrologist put him on indapamide 2.5 mg a day.  He has chronic low back pain that waxes and wanes.  He works on concrete all day.  Does not radiate down his legs.  Has chronic bilateral knee pain, no swelling or redness.  Worse last month. Has documented osteoarthritis.  Remote history of left ACL and PCL repair. History of steroid injections in the past, these have helped at least 3 months.  He has not had any a while. He also has had some viscosupplementation injections in the past by orthopedics and  he says these helped for a while. No locking, no buckling.  ROS as above, plus--> no fevers, no CP, no SOB, no wheezing, no cough, no dizziness, no HAs, no rashes, no melena/hematochezia.  No polyuria or polydipsia.  No myalgias.  No focal weakness, paresthesias, or tremors.  No acute vision or hearing abnormalities.  No dysuria or unusual/new urinary urgency or frequency.  No recent changes in lower legs. No n/v/d or abd pain.  No palpitations.     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   Chronic renal insufficiency, stage 2 (mild)    borderline II/III: nephrol eval 10/2020->suspected cause is combo of recurrent kidney stones, polycystic kidney dz, HTN, and DM.   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  No mass effect or enhancing lesions as of 2019 MRI.  Renal u/s kidneys about 14 cm bilat, multiple benign cysts, 2 kidney stones on L. Neph plans f/u CT 2025 to monitor prog of kidney size.   Constipation    [place]  Dx'd at [facility] CPE   ED (erectile dysfunction)    Sildenafil trial by urol 05/2018.   Gallstones    asymptomatic   GERD (gastroesophageal reflux disease)    Dr. OPaulita Fujita  Heart murmur  Found at age 28, w/u unrevealing.  No cardiac symptoms.   Hypercholesterolemia    Kidney stones    [facility]: takes indapamide 1.80m qd + K cit bid.   Metabolic stone dz: renal leak hypercalcuria.   Obesity, Class I, BMI 30-34.9    OSA (obstructive sleep apnea) approx 2008   was put on CPAP but says he fell out of the habit of using it due to "laziness".  Has CPAP machine but doesn't use it.   Osteoarthritis, multiple sites     Hands and right foot and both knees.  Ortho started meloxicam 08/2018.   Primary osteoarthritis of both knees    Vsco supplementation helpful in ths past.   Prostate cancer screening    Urol did DRE (wnl) and PSA 05/24/18. [facility] PSA 1.1 at u[facility].   Simple hepatic cyst    many, tiny    Past Surgical History:  Procedure Laterality Date   ANTERIOR CRUCIATE LIGAMENT REPAIR  1991   ACL and PCL left knee   COLONOSCOPY  02/01/2018   Tubular adenoma, repeat 5 years    ETT  03/2015   NORMAL   [place]     14cm bilaterally with multiple cysts mostly benign and 2 kideny stones on the left side. Repeat CT in 2025    Outpatient Medications Prior to Visit  Medication Sig Dispense Refill   atorvastatin (LIPITOR) 20 MG tablet Take 1 tablet (20 mg total) by mouth daily. 90 tablet 3   indapamide (LOZOL) 2.5 MG tablet Take 2.5 mg by mouth daily.     metFORMIN (GLUCOPHAGE) 1000 MG tablet Take 1 tablet (1,000 mg total) by mouth 2 (two) times daily with a meal. 180 tablet 3   omeprazole (PRILOSEC) 40 MG capsule Take 1 capsule (40 mg total) by mouth daily. 90 capsule 3   Potassium Citrate 15 MEQ (1620 MG) TBCR Take 1 tablet by mouth 2 (two) times daily. 180 tablet 3   pioglitazone (ACTOS) 45 MG tablet TAKE 1 TABLET BY MOUTH DAILY 90 tablet 0   No facility-administered medications prior to visit.    Allergies  Allergen Reactions   Amoxicillin Rash   Ciprofloxacin Hcl Rash    ROS As per HPI  PE:    10/30/2022    8:20 AM 04/17/2022    3:28 PM 10/17/2021    3:26 PM  Vitals with BMI  Height _0  _1  _2   Weight 286 lbs 10 oz 279 lbs 6 oz 279 lbs 6 oz  BMI 34.9 377.93390.30 Systolic [id number] Diastolic 80 80 74  Pulse 71 72 73     Physical Exam  Gen: Alert, well appearing.  Patient is oriented to person, place, time, and situation. AFFECT: pleasant, lucid thought and speech. Knees: Neither side has any effusion, erythema, or warmth.  Active and  passive range of motion go to about 75 degrees bilaterally.  No crepitus.  No medial or lateral joint line tenderness.  No lateral or medial instability.  Patellar grind negative.  Lower extremity strength 5 out of 5 proximally and distally bilaterally.  LABS:  Last CBC Lab Results  Component Value Date   WBC 5.7 04/17/2022   HGB 14.0 08/20/2022   HCT 40.9 04/17/2022   MCV 87.4 04/17/2022   MCH 30.6 04/17/2022   RDW 12.3 04/17/2022   PLT 253 022/63/3354  Last metabolic panel Lab Results  Component Value Date  GLUCOSE 94 04/17/2022   NA 139 08/20/2022   K 3.9 08/20/2022   CL 100 08/20/2022   CO2 25 (A) 08/20/2022   BUN 22 (A) 08/20/2022   CREATININE 1.6 (A) 08/20/2022   EGFR 51 08/20/2022   CALCIUM 9.3 08/20/2022   PROT 7.2 04/17/2022   ALBUMIN 4.5 08/20/2022   BILITOT 0.9 04/17/2022   ALKPHOS 70 03/20/2021   AST 20 04/17/2022   ALT 19 04/17/2022   ANIONGAP 12 02/17/2020   Last lipids Lab Results  Component Value Date   CHOL 125 04/17/2022   HDL 50 04/17/2022   LDLCALC 58 04/17/2022   TRIG 88 04/17/2022   CHOLHDL 2.5 04/17/2022   Last hemoglobin A1c Lab Results  Component Value Date   HGBA1C 7.2 (H) 04/17/2022   Last thyroid functions Lab Results  Component Value Date   TSH 2.18 04/17/2022   IMPRESSION AND PLAN:  #1 diabetes without complication. Will do hemoglobin A1c as well as electrolytes and creatinine today. Continue metformin 1000 mg twice daily and Actos 45 mg a day.  #2 hyperlipidemia, doing well on atorvastatin 20 mg a day. Lipid panel today.  3. Chronic renal insufficiency stage II/III. He no longer takes any NSAIDs.  He hydrates very well. Electrolytes and creatinine today He is followed by nephrology, has follow-up next week. Continue indapamide 2.5 mg a day.  #4 bilateral knee osteoarthritis.  History of improvement for at least 3 months in the past with steroid injections. He elected for steroid injections  today--bilateral.  Ultrasound-guided injection is preferred based on studies that show increased duration, increased effect, greater accuracy, decreased procedural pain, increased response rate, and decreased cost with ultrasound-guided versus blind injection. Procedure: Real-time ultrasound guided injection of bilateral knees. Device: [employer] informed consent obtained.  Timeout conducted.  No overlying erythema, induration, or other signs of local infection. After sterile prep with Betadine, injected 3 cc of 1% lidocaine without epinephrine followed by a mixture of 40 mg Kenalog and 3 cc lidocaine without epinephrine, lateral approach into suprapatellar pouch.  Injectate seen filling suprapatellar pouch. Patient tolerated the procedure well.  No immediate complications.  Post-injection care discussed. Advised to call if fever/chills, erythema, drainage, or persistent bleeding.  Impression: Technically successful ultrasound-guided injection of each knee.  An After Visit Summary was printed and given to the patient.  FOLLOW UP: Return in about 6 months (around 05/01/2023) for annual CPE (fasting.  Signed:  Crissie Sickles, MD           10/30/2022

## 2022-11-03 ENCOUNTER — Ambulatory Visit (HOSPITAL_BASED_OUTPATIENT_CLINIC_OR_DEPARTMENT_OTHER)
Admission: RE | Admit: 2022-11-03 | Discharge: 2022-11-03 | Disposition: A | Discharge status: Home / Self Care | Payer: BC Managed Care – PPO | Source: Ambulatory Visit | Attending: Family Medicine | Admitting: Family Medicine

## 2022-11-03 DIAGNOSIS — Q6102 Congenital multiple renal cysts: Secondary | ICD-10-CM | POA: Diagnosis not present

## 2022-11-03 DIAGNOSIS — N200 Calculus of kidney: Secondary | ICD-10-CM | POA: Diagnosis not present

## 2022-11-03 DIAGNOSIS — M545 Low back pain, unspecified: Secondary | ICD-10-CM | POA: Diagnosis not present

## 2022-11-03 DIAGNOSIS — G8929 Other chronic pain: Secondary | ICD-10-CM | POA: Insufficient documentation

## 2022-11-03 DIAGNOSIS — N5201 Erectile dysfunction due to arterial insufficiency: Secondary | ICD-10-CM | POA: Diagnosis not present

## 2022-11-10 DIAGNOSIS — J209 Acute bronchitis, unspecified: Secondary | ICD-10-CM | POA: Diagnosis not present

## 2022-11-10 DIAGNOSIS — J0100 Acute maxillary sinusitis, unspecified: Secondary | ICD-10-CM | POA: Diagnosis not present

## 2022-11-10 DIAGNOSIS — Z6833 Body mass index (BMI) 33.0-33.9, adult: Secondary | ICD-10-CM | POA: Diagnosis not present

## 2022-11-10 DIAGNOSIS — E1165 Type 2 diabetes mellitus with hyperglycemia: Secondary | ICD-10-CM | POA: Diagnosis not present

## 2022-11-12 ENCOUNTER — Encounter: Payer: Self-pay | Admitting: Family Medicine

## 2022-11-13 ENCOUNTER — Encounter: Payer: Self-pay | Admitting: Family Medicine

## 2022-12-22 ENCOUNTER — Telehealth (INDEPENDENT_AMBULATORY_CARE_PROVIDER_SITE_OTHER): Payer: BC Managed Care – PPO | Admitting: Family Medicine

## 2022-12-22 ENCOUNTER — Encounter: Payer: Self-pay | Admitting: Family Medicine

## 2022-12-22 DIAGNOSIS — J988 Other specified respiratory disorders: Secondary | ICD-10-CM | POA: Diagnosis not present

## 2022-12-22 DIAGNOSIS — E119 Type 2 diabetes mellitus without complications: Secondary | ICD-10-CM

## 2022-12-22 DIAGNOSIS — U071 COVID-19: Secondary | ICD-10-CM | POA: Diagnosis not present

## 2022-12-22 MED ORDER — NIRMATRELVIR/RITONAVIR (PAXLOVID)TABLET: 3.0000 | ORAL_TABLET | Freq: Two times a day (BID) | ORAL | 0 refills | Status: AC

## 2022-12-22 MED ORDER — TIRZEPATIDE 2.5 MG/0.5ML ~~LOC~~ SOAJ: 2.5000 mg | SUBCUTANEOUS | 0 refills | Status: DC

## 2022-12-22 NOTE — Telephone Encounter (Signed)
Pt states all meds are more expensive.

## 2022-12-22 NOTE — Telephone Encounter (Signed)
I discussed this with pt at virtual visit today: [email or web address] rx'd.

## 2022-12-22 NOTE — Progress Notes (Signed)
Virtual Visit via Video Note  I connected with Adrian Cook  on 12/22/22 at 11:00 AM EST by a video enabled telemedicine application and verified that I am speaking with the correct person using two identifiers.  Location patient: [place] Location provider:work or home office Persons participating in the virtual visit: patient, provider  I discussed the limitations and requested verbal permission for telemedicine visit. The patient expressed understanding and agreed to proceed.  CC: 57 y/o male being seen today for "covid". 2 d nasal cong, cough, fever, loss of taste and smell. No n/v/d or SOB or CP. Home covid test yesterday was POSITIVE  ROS: See pertinent positives and negatives per HPI.  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   Chronic renal insufficiency, stage 2 (mild)    borderline II/III: nephrol eval 10/2020->suspected cause is combo of recurrent kidney stones, polycystic kidney dz, HTN, and DM.   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  No mass effect or enhancing lesions as of 2019 MRI.  Renal u/s kidneys about 14 cm bilat, multiple benign cysts, 2 kidney stones on L. Neph plans f/u CT 2025 to monitor prog of kidney size.   Constipation    Diabetes mellitus without complication ([place]) 83/1517   Dx'd at [facility] CPE   ED (erectile dysfunction)    Sildenafil trial by urol 05/2018.   Gallstones    asymptomatic   GERD (gastroesophageal reflux disease)    Dr. Paulita Fujita   Heart murmur    Found at age 12, w/u unrevealing.  No cardiac symptoms.   Hypercholesterolemia    Kidney stones    [facility]: takes indapamide 1.25mg  qd + K cit bid.   Metabolic stone dz: renal leak  hypercalcuria.   Obesity, Class I, BMI 30-34.9    OSA (obstructive sleep apnea) approx 2008   was put on CPAP but says he fell out of the habit of using it due to "laziness".  Has CPAP machine but doesn't use it.   Osteoarthritis, multiple sites    Hands and right foot and both knees.  Ortho started meloxicam 08/2018.   Primary osteoarthritis of both knees    Vsco supplementation helpful in ths past.   Prostate cancer screening    Urol did DRE (wnl) and PSA 05/24/18. PSA 0.89 Nov 24, 2019 at [facility]. 02/18/21 PSA 1.1 at urol.   Simple hepatic cyst    many, tiny    Past Surgical History:  Procedure Laterality Date   ANTERIOR CRUCIATE LIGAMENT REPAIR  1991   ACL and PCL left knee   COLONOSCOPY  02/01/2018   Tubular adenoma, repeat 5 years    ETT  03/2015   NORMAL   US RENAL/AORTA     14cm bilaterally with multiple cysts mostly benign and 2 kideny stones on the left side. Repeat CT in 2025     Current Outpatient Medications:    atorvastatin (LIPITOR) 20 MG tablet, Take 1 tablet (20 mg total) by mouth daily., Disp: 90 tablet, Rfl: 3   indapamide (LOZOL) 2.5 MG tablet, Take 2.5 mg by mouth daily., Disp: , Rfl:    metFORMIN (GLUCOPHAGE) 1000 MG tablet, Take 1 tablet (1,000 mg total) by mouth 2 (two) times daily  with a meal., Disp: 180 tablet, Rfl: 3   omeprazole (PRILOSEC) 40 MG capsule, Take 1 capsule (40 mg total) by mouth daily., Disp: 90 capsule, Rfl: 3   pioglitazone (ACTOS) 45 MG tablet, Take 1 tablet (45 mg total) by mouth daily., Disp: 90 tablet, Rfl: 3   Potassium Citrate 15 MEQ (1620 MG) TBCR, Take 1 tablet by mouth 2 (two) times daily., Disp: 180 tablet, Rfl: 3  EXAM:  VITALS per patient if applicable:     82/99/3716   10:35 AM 10/30/2022    8:20 AM 04/17/2022    3:28 PM  Vitals with BMI  Height  6\' 4"  6\' 4"   Weight  286 lbs 10 oz 279 lbs 6 oz  BMI  96.7 89.38  Systolic 101 751 025  Diastolic 80 80 80  Pulse  71 72     GENERAL: alert, oriented, appears well and in no  acute distress  HEENT: atraumatic, conjunttiva clear, no obvious abnormalities on inspection of external nose and ears  NECK: normal movements of the head and neck  LUNGS: on inspection no signs of respiratory distress, breathing rate appears normal, no obvious gross SOB, gasping or wheezing  CV: no obvious cyanosis  MS: moves all visible extremities without noticeable abnormality  PSYCH/NEURO: pleasant and cooperative, no obvious depression or anxiety, speech and thought processing grossly intact  LABS: none today    Chemistry      Component Value Date/Time   NA 140 10/30/2022 0910   NA 139 08/20/2022 0000   K 4.3 10/30/2022 0910   CL 100 10/30/2022 0910   CO2 32 10/30/2022 0910   BUN 23 10/30/2022 0910   BUN 22 (A) 08/20/2022 0000   CREATININE 1.16 10/30/2022 0910   CREATININE 1.36 (H) 04/17/2022 1559   GLU 184 08/20/2022 0000      Component Value Date/Time   CALCIUM 9.7 10/30/2022 0910   ALKPHOS 70 03/20/2021 1555   AST 20 04/17/2022 1559   ALT 19 04/17/2022 1559   BILITOT 0.9 04/17/2022 1559     Lab Results  Component Value Date   HGBA1C 7.9 (H) 10/30/2022   ASSESSMENT AND PLAN:  Discussed the following assessment and plan:  #1 COVID respiratory infection.  Mild to moderate.  He is within the treatment window for antivirals. Patient's risk factors for complication from COVID infection:  diabetes and BMI 35. GFR 70 Dec 2023. Paxlovid prescribed.  Take Mucinex DM and Tylenol as needed.  2.  Diabetes, poor control. Most recent hemoglobin A1c was up to 7.9%.  I recommended he stop pioglitazone and check with insurance about coverage for GLP-1 receptor agonist and SGLT2 receptor inhibitors. Jardiance and Darcel Bayley are options he is willing to pursue. We decided on Mounjaro, start 2.5 mg subcu weekly.  Follow-up 2 weeks.  I discussed the assessment and treatment plan with the patient. The patient was provided an opportunity to ask questions and all were answered.  The patient agreed with the plan and demonstrated an understanding of the instructions.   F/u: 2 weeks, virtual or in person follow-up diabetes  Signed:  Crissie Sickles, MD           12/22/2022

## 2022-12-23 ENCOUNTER — Ambulatory Visit (INDEPENDENT_AMBULATORY_CARE_PROVIDER_SITE_OTHER): Payer: BC Managed Care – PPO

## 2022-12-23 ENCOUNTER — Telehealth: Payer: Self-pay

## 2022-12-23 DIAGNOSIS — U071 COVID-19: Secondary | ICD-10-CM | POA: Diagnosis not present

## 2022-12-23 LAB — POC COVID19 BINAXNOW: SARS Coronavirus 2 Ag: POSITIVE — AB

## 2022-12-23 NOTE — Telephone Encounter (Signed)
Please advise if pt can come in for a NV swabbing.

## 2022-12-23 NOTE — Progress Notes (Signed)
Pt came in today for poc covid test. No complications during visit. Pt agreed to receive results on mychart.

## 2022-12-23 NOTE — Telephone Encounter (Signed)
Done

## 2022-12-23 NOTE — Telephone Encounter (Signed)
Patient advised to be here at 3pm

## 2022-12-23 NOTE — Telephone Encounter (Signed)
Patient had virtual visit with Dr. Anitra Lauth yesterday.  His work will not take home covid test results (positive) and is requiring patient to have COVID test from our office.  Can patient come by office today and get test done so he can take to work?  Please call [phone]. Send as high priority so appt can be made today if approved

## 2022-12-23 NOTE — Telephone Encounter (Signed)
Yes covid test pls, Dx covid infection

## 2022-12-27 ENCOUNTER — Encounter: Payer: Self-pay | Admitting: Family Medicine

## 2023-01-18 ENCOUNTER — Other Ambulatory Visit: Payer: Self-pay | Admitting: Family Medicine

## 2023-01-20 ENCOUNTER — Telehealth: Payer: Self-pay | Admitting: Family Medicine

## 2023-01-20 NOTE — Telephone Encounter (Signed)
Pt is having issues getting his mounjaro filled and the pharmacy is instructing him to contact us. His pharmacy is correct. Please call and discuss with patient if appointment is necessary

## 2023-01-20 NOTE — Telephone Encounter (Signed)
Pt advised fu appt needed for refill; appt scheduled

## 2023-01-21 ENCOUNTER — Encounter: Payer: Self-pay | Admitting: Family Medicine

## 2023-01-21 ENCOUNTER — Telehealth (INDEPENDENT_AMBULATORY_CARE_PROVIDER_SITE_OTHER): Payer: BC Managed Care – PPO | Admitting: Family Medicine

## 2023-01-21 VITALS — Ht 76.0 in | Wt 270.0 lb

## 2023-01-21 DIAGNOSIS — E119 Type 2 diabetes mellitus without complications: Secondary | ICD-10-CM

## 2023-01-21 MED ORDER — TIRZEPATIDE 2.5 MG/0.5ML ~~LOC~~ SOAJ: 2.5000 mg | SUBCUTANEOUS | 2 refills | Status: DC

## 2023-01-21 NOTE — Progress Notes (Signed)
Virtual Visit via Video Note  I connected with Adrian Cook  on 01/21/23 at  3:20 PM EST by a video enabled telemedicine application and verified that I am speaking with the correct person using two identifiers.  Location patient: [place] Location provider:work or home office Persons participating in the virtual visit: patient, provider  I discussed the limitations and requested verbal permission for telemedicine visit. The patient expressed understanding and agreed to proceed.  CC:  57 year old male being seen today for 1 month follow-up poorly controlled diabetes. Started Mounjaro 2.5 mg weekly about 1 month ago.  Also continued him on full dose pioglitazone and metformin.  He just has a little bit of brief nausea since getting on the [facility]. No vomiting or abdominal pain. He is pleased with his weight loss: On his scale this morning he was 270.  Last weight we have here is 286 on October 30, 2022. He is not monitoring his glucoses. He does take pioglitazone 45 mg a day and metformin 1000 mg twice a day.  ROS: See pertinent positives and negatives per HPI.  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   Chronic renal insufficiency, stage 2 (mild)    borderline II/III: nephrol eval 10/2020->suspected cause is combo of recurrent kidney stones, polycystic kidney dz, HTN, and DM.   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  No mass effect or enhancing lesions as of 2019 MRI.  Renal u/s kidneys about 14 cm bilat, multiple benign cysts, 2 kidney stones on L. Neph plans f/u CT 2025 to monitor prog of kidney size.   Constipation    Diabetes mellitus without complication ([place]) 0000000   Dx'd at  [employer] CPE   ED (erectile dysfunction)    Sildenafil trial by urol 05/2018.   Gallstones    asymptomatic   GERD (gastroesophageal reflux disease)    Dr. Paulita Fujita   Heart murmur    Found at age 2, w/u unrevealing.  No cardiac symptoms.   Hypercholesterolemia    Kidney stones    [facility]: takes indapamide 1.'25mg'$  qd + K cit bid.   Metabolic stone dz: renal leak hypercalcuria.   Obesity, Class I, BMI 30-34.9    OSA (obstructive sleep apnea) approx 2008   was put on CPAP but says he fell out of the habit of using it due to "laziness".  Has CPAP machine but doesn't use it.   Osteoarthritis, multiple sites    Hands and right foot and both knees.  Ortho started meloxicam 08/2018.   Primary osteoarthritis of both knees    Vsco supplementation helpful in ths past.   Prostate cancer screening    Urol did DRE (wnl) and PSA 05/24/18. PSA 0.89 Nov 24, 2019 at [facility]. 02/18/21 PSA 1.1 at urol.   Simple hepatic cyst    many, tiny    Past Surgical History:  Procedure Laterality Date   ANTERIOR CRUCIATE LIGAMENT REPAIR  1991   ACL and PCL left knee   COLONOSCOPY  02/01/2018   Tubular adenoma, repeat 5 years    ETT  03/2015   NORMAL   US RENAL/AORTA     14cm bilaterally with multiple cysts mostly benign and 2 kideny stones on the left  side. Repeat CT in 2025     Current Outpatient Medications:    atorvastatin (LIPITOR) 20 MG tablet, Take 1 tablet (20 mg total) by mouth daily., Disp: 90 tablet, Rfl: 3   indapamide (LOZOL) 2.5 MG tablet, Take 2.5 mg by mouth daily., Disp: , Rfl:    metFORMIN (GLUCOPHAGE) 1000 MG tablet, Take 1 tablet (1,000 mg total) by mouth 2 (two) times daily with a meal., Disp: 180 tablet, Rfl: 3   omeprazole (PRILOSEC) 40 MG capsule, Take 1 capsule (40 mg total) by mouth daily., Disp: 90 capsule, Rfl: 3   pioglitazone (ACTOS) 45 MG tablet, Take 1 tablet (45 mg total) by mouth daily., Disp: 90 tablet, Rfl: 3   Potassium Citrate 15 MEQ (1620 MG) TBCR, Take 1 tablet by mouth 2 (two)  times daily., Disp: 180 tablet, Rfl: 3   tirzepatide (MOUNJARO) 2.5 MG/0.5ML Pen, Inject 2.5 mg into the skin once a week., Disp: 2 mL, Rfl: 0  EXAM:  VITALS per patient if applicable:     [id number]   10:35 AM 10/30/2022    8:20 AM 04/17/2022    3:28 PM  Vitals with BMI  Height  '6\' 4"'$  '6\' 4"'$   Weight  286 lbs 10 oz 279 lbs 6 oz  BMI  [id number]  Systolic [id number]  Diastolic 80 80 80  Pulse  71 72    GENERAL: alert, oriented, appears well and in no acute distress  HEENT: atraumatic, conjunttiva clear, no obvious abnormalities on inspection of external nose and ears  NECK: normal movements of the head and neck  LUNGS: on inspection no signs of respiratory distress, breathing rate appears normal, no obvious gross SOB, gasping or wheezing  CV: no obvious cyanosis  MS: moves all visible extremities without noticeable abnormality  PSYCH/NEURO: pleasant and cooperative, no obvious depression or anxiety, speech and thought processing grossly intact  LABS:  none today    Chemistry      Component Value Date/Time   NA 140 10/30/2022 0910   NA 139 08/20/2022 0000   K 4.3 10/30/2022 0910   CL 100 10/30/2022 0910   CO2 32 10/30/2022 0910   BUN 23 10/30/2022 0910   BUN 22 (A) 08/20/2022 0000   CREATININE 1.16 10/30/2022 0910   CREATININE 1.36 (H) 04/17/2022 1559   GLU 184 08/20/2022 0000      Component Value Date/Time   CALCIUM 9.7 10/30/2022 0910   ALKPHOS 70 03/20/2021 1555   AST 20 04/17/2022 1559   ALT 19 04/17/2022 1559   BILITOT 0.9 04/17/2022 1559     Lab Results  Component Value Date   CHOL 113 10/30/2022   HDL 42.10 10/30/2022   LDLCALC 46 10/30/2022   TRIG 123.0 10/30/2022   CHOLHDL 3 10/30/2022   Lab Results  Component Value Date   HGBA1C 7.9 (H) 10/30/2022   ASSESSMENT AND PLAN:  Discussed the following assessment and plan:  #1 diabetes without complication. Doing well since starting Mounjaro 2.5 mg weekly about 1 month ago (wt loss is  good). Continue this as well as pioglitazone 45 mg a day and metformin 1000 mg twice a day. Come to the lab in 1 week for nonfasting hemoglobin A1c and basic metabolic panel.   I discussed the assessment and treatment plan with the patient. The patient was provided an opportunity to ask questions and all were answered. The patient agreed with the plan and demonstrated an understanding of the instructions.   F/u: 3 mo RCI.  Lab 1 wk (non-fasting)  Signed:  Crissie Sickles, MD           01/21/2023

## 2023-02-10 ENCOUNTER — Other Ambulatory Visit (INDEPENDENT_AMBULATORY_CARE_PROVIDER_SITE_OTHER): Payer: BC Managed Care – PPO

## 2023-02-10 DIAGNOSIS — E119 Type 2 diabetes mellitus without complications: Secondary | ICD-10-CM

## 2023-02-11 LAB — BASIC METABOLIC PANEL
BUN: 25 mg/dL — ABNORMAL HIGH (ref 6–23)
CO2: 29 mEq/L (ref 19–32)
Calcium: 9.7 mg/dL (ref 8.4–10.5)
Chloride: 103 mEq/L (ref 96–112)
Creatinine, Ser: 1.1 mg/dL (ref 0.40–1.50)
GFR: 75.5 mL/min (ref >60.00)
Glucose, Bld: 104 mg/dL — ABNORMAL HIGH (ref 70–99)
Potassium: 4.1 mEq/L (ref 3.5–5.1)
Sodium: 140 mEq/L (ref 135–145)

## 2023-02-11 LAB — HEMOGLOBIN A1C: Hgb A1c MFr Bld: 7.1 % — ABNORMAL HIGH (ref 4.6–6.5)

## 2023-02-16 ENCOUNTER — Encounter: Payer: Self-pay | Admitting: Family Medicine

## 2023-02-16 NOTE — Telephone Encounter (Signed)
Spoke with pharmacy, insurance will not cover the medication until 4/5 and medication is not in stock at [place]. Pharmacies that have medication in [place] are [facility], [address] in [place]. Pt will still have to pay out of pocket $1300.

## 2023-03-23 ENCOUNTER — Other Ambulatory Visit: Payer: Self-pay | Admitting: Family Medicine

## 2023-04-14 ENCOUNTER — Other Ambulatory Visit: Payer: Self-pay | Admitting: Family Medicine

## 2023-04-23 ENCOUNTER — Telehealth: Payer: Self-pay | Admitting: Family Medicine

## 2023-04-23 MED ORDER — TIRZEPATIDE 2.5 MG/0.5ML ~~LOC~~ SOAJ: 2.5000 mg | SUBCUTANEOUS | 0 refills | Status: DC

## 2023-04-23 NOTE — Telephone Encounter (Signed)
Patient reports that he has been having back and forth issue with the pharmacy. He called them and they have told him to call us in regards to filling his mounjaro. He has been having trouble getting it filled. I asked him was it due tothem not having it in stock and he said that was not the issue and was just told to call us. Please give the patient a call to discuss problems with medication getting filled.

## 2023-04-23 NOTE — Telephone Encounter (Signed)
LVM for pt to return call.   Note: 1 month supply sent to pharmacy, please inform patient.

## 2023-04-23 NOTE — Telephone Encounter (Signed)
Patient returned call to [facility].  Patient aware refill sent to pharmacy.  No other questions/concerns at this time.

## 2023-05-03 ENCOUNTER — Ambulatory Visit (INDEPENDENT_AMBULATORY_CARE_PROVIDER_SITE_OTHER): Payer: BC Managed Care – PPO | Admitting: Family Medicine

## 2023-05-03 ENCOUNTER — Encounter: Payer: Self-pay | Admitting: Family Medicine

## 2023-05-03 VITALS — BP 137/81 | HR 86 | Ht 78.0 in | Wt 270.4 lb

## 2023-05-03 DIAGNOSIS — N182 Chronic kidney disease, stage 2 (mild): Secondary | ICD-10-CM

## 2023-05-03 DIAGNOSIS — M545 Low back pain, unspecified: Secondary | ICD-10-CM

## 2023-05-03 DIAGNOSIS — E7800 Pure hypercholesterolemia, unspecified: Secondary | ICD-10-CM

## 2023-05-03 DIAGNOSIS — Z0000 Encounter for general adult medical examination without abnormal findings: Secondary | ICD-10-CM | POA: Diagnosis not present

## 2023-05-03 DIAGNOSIS — Z7985 Long-term (current) use of injectable non-insulin antidiabetic drugs: Secondary | ICD-10-CM

## 2023-05-03 DIAGNOSIS — E119 Type 2 diabetes mellitus without complications: Secondary | ICD-10-CM

## 2023-05-03 DIAGNOSIS — Z7689 Persons encountering health services in other specified circumstances: Secondary | ICD-10-CM

## 2023-05-03 DIAGNOSIS — G8929 Other chronic pain: Secondary | ICD-10-CM

## 2023-05-03 DIAGNOSIS — Z125 Encounter for screening for malignant neoplasm of prostate: Secondary | ICD-10-CM

## 2023-05-03 MED ORDER — TIRZEPATIDE 5 MG/0.5ML ~~LOC~~ SOAJ: 5.0000 mg | SUBCUTANEOUS | 1 refills | Status: DC

## 2023-05-03 NOTE — Progress Notes (Signed)
Office Note 05/03/2023  CC:  Chief Complaint  Patient presents with   Annual Exam   Patient is a 57 y.o. male who is here for annual health maintenance exam and 3 month follow-up diabetes, hyperlipidemia, and chronic renal insufficiency stage II/III. A/P as of last visit: "#1 diabetes without complication. Doing well since starting Mounjaro 2.5 mg weekly about 1 month ago (wt loss is good). Continue this as well as pioglitazone 45 mg a day and metformin 1000 mg twice a day. Come to the lab in 1 week for nonfasting hemoglobin A1c and basic metabolic panel."  INTERIM HX: Adrian Cook is doing well. He has some recurrent low back pain without sciatica that gives him problem a lot of days. He takes Tylenol but does not get as much relief from this as he does NSAID. We have stopped NSAIDs for the most part due to his mild chronic renal insufficiency.  No problems on Mounjaro 2.5 mg weekly. No home glucose monitoring.   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   Chronic renal insufficiency, stage 2 (mild)    borderline II/III: nephrol eval 10/2020->suspected cause is combo of recurrent kidney stones, polycystic kidney dz, HTN, and DM.   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  No mass effect or enhancing lesions as of 2019 MRI.  Renal u/s kidneys about 14 cm bilat, multiple benign cysts, 2 kidney stones on L. Neph plans f/u CT 2025 to monitor prog of kidney size.   Constipation    Diabetes mellitus without complication (HCC) 10/2017   Dx'd at [facility] CPE   ED (erectile dysfunction)    Sildenafil trial by urol 05/2018.   Gallstones    asymptomatic   GERD (gastroesophageal reflux  disease)    Dr. Dulce Sellar   Heart murmur    Found at age 75, w/u unrevealing.  No cardiac symptoms.   Hypercholesterolemia    Kidney stones    [facility]: takes indapamide 1.25mg  qd + K cit bid.   Metabolic stone dz: renal leak hypercalcuria.   Obesity, Class I, BMI 30-34.9    OSA (obstructive sleep apnea) approx 2008   was put on CPAP but says he fell out of the habit of using it due to "laziness".  Has CPAP machine but doesn't use it.   Osteoarthritis, multiple sites    Hands and right foot and both knees.  Ortho started meloxicam 08/2018.   Primary osteoarthritis of both knees    Vsco supplementation helpful in ths past.   Prostate cancer screening    Urol did DRE (wnl) and PSA 05/24/18. PSA 0.89 Nov 24, 2019 at [facility]. 02/18/21 PSA 1.1 at urol.   Simple hepatic cyst    many, tiny    Past Surgical History:  Procedure Laterality Date   ANTERIOR CRUCIATE LIGAMENT REPAIR  1991   ACL and PCL left knee   COLONOSCOPY  02/01/2018   Tubular adenoma, repeat 5 years    ETT  03/2015   NORMAL   US RENAL/AORTA     14cm bilaterally with multiple cysts mostly benign and 2 kideny stones on the left side. Repeat CT in 2025    Family History  Problem Relation Age of Onset  Osteoarthritis Mother    Arthritis Mother    Heart failure Father    Leukemia Maternal Grandfather    Colon cancer Neg Hx    Prostate cancer Neg Hx     Social History   Socioeconomic History   Marital status: Married    Spouse name: Not on file   Number of children: Not on file   Years of education: Not on file   Highest education level: 12th grade  Occupational History   Not on file  Tobacco Use   Smoking status: Never   Smokeless tobacco: Never  Vaping Use   Vaping Use: Never used  Substance and Sexual Activity   Alcohol use: No    Alcohol/week: 0.0 standard drinks of alcohol   Drug use: No   Sexual activity: Not on file  Other Topics Concern   Not on file  Social History Narrative   Married, no  children.   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   No T/A/Ds.   Social Determinants of Health   Financial Resource Strain: Low Risk  (10/29/2022)   Overall Financial Resource Strain (CARDIA)    Difficulty of Paying Living Expenses: Not hard at all  Food Insecurity: No Food Insecurity (10/29/2022)   Hunger Vital Sign    Worried About Running Out of Food in the Last Year: Never true    Ran Out of Food in the Last Year: Never true  Transportation Needs: No Transportation Needs (10/29/2022)   PRAPARE - Administrator, Civil Service (Medical): No    Lack of Transportation (Non-Medical): No  Physical Activity: Unknown (10/29/2022)   Exercise Vital Sign    Days of Exercise per Week: 0 days    Minutes of Exercise per Session: Not on file  Stress: No Stress Concern Present (10/29/2022)   [employer] of Occupational Health - Occupational Stress Questionnaire    Feeling of Stress : Not at all  Social Connections: Unknown (10/29/2022)   Social Connection and Isolation Panel [NHANES]    Frequency of Communication with Friends and Family: More than three times a week    Frequency of Social Gatherings with Friends and Family: More than three times a week    Attends Religious Services: More than 4 times per year    Active Member of [employer] or Organizations: Patient declined    Attends Engineer, structural: Not on file    Marital Status: Married  Catering manager Violence: Not on file    Outpatient Medications Prior to Visit  Medication Sig Dispense Refill   atorvastatin (LIPITOR) 20 MG tablet TAKE 1 TABLET BY MOUTH DAILY 90 tablet 3   indapamide (LOZOL) 1.25 MG tablet Take 1.25 mg by mouth daily.     metFORMIN (GLUCOPHAGE) 1000 MG tablet TAKE 1 TABLET BY MOUTH TWICE  DAILY WITH A MEAL 180 tablet 3   omeprazole (PRILOSEC) 40 MG capsule TAKE 1 CAPSULE BY MOUTH DAILY 90 capsule 3   pioglitazone (ACTOS) 45 MG tablet Take 1 tablet (45 mg  total) by mouth daily. 90 tablet 3   Potassium Citrate 15 MEQ (1620 MG) TBCR Take 1 tablet by mouth 2 (two) times daily. 180 tablet 3   tirzepatide (MOUNJARO) 2.5 MG/0.5ML Pen Inject 2.5 mg into the skin once a week. 2 mL 0   indapamide (LOZOL) 2.5 MG tablet Take 2.5 mg by mouth daily.     No facility-administered medications prior to visit.    Allergies  Allergen Reactions   Amoxicillin Rash   Ciprofloxacin Hcl Rash    Review of Systems  Constitutional:  Negative for appetite change, chills, fatigue and fever.  HENT:  Negative for congestion, dental problem, ear pain and sore throat.   Eyes:  Negative for discharge, redness and visual disturbance.  Respiratory:  Negative for cough, chest tightness, shortness of breath and wheezing.   Cardiovascular:  Negative for chest pain, palpitations and leg swelling.  Gastrointestinal:  Negative for abdominal pain, blood in stool, diarrhea, nausea and vomiting.  Genitourinary:  Negative for difficulty urinating, dysuria, flank pain, frequency, hematuria and urgency.  Musculoskeletal:  Positive for back pain. Negative for arthralgias, joint swelling, myalgias and neck stiffness.  Skin:  Negative for pallor and rash.  Neurological:  Negative for dizziness, speech difficulty, weakness and headaches.  Hematological:  Negative for adenopathy. Does not bruise/bleed easily.  Psychiatric/Behavioral:  Negative for confusion and sleep disturbance. The patient is not nervous/anxious.     PE;    05/03/2023    3:30 PM 01/21/2023    3:10 PM 10/30/2022   10:35 AM  Vitals with BMI  Height 6\' 6"  6\' 4"    Weight 270 lbs 6 oz 270 lbs   BMI 31.25 32.88   Systolic 137  130  Diastolic 81  80  Pulse 86      Gen: Alert, well appearing.  Patient is oriented to person, place, time, and situation. AFFECT: pleasant, lucid thought and speech. ENT: Ears: EACs clear, normal epithelium.  TMs with good light reflex and landmarks bilaterally.  Eyes: no injection,  icteris, swelling, or exudate.  EOMI, PERRLA. Nose: no drainage or turbinate edema/swelling.  No injection or focal lesion.  Mouth: lips without lesion/swelling.  Oral mucosa pink and moist.  Dentition intact and without obvious caries or gingival swelling.  Oropharynx without erythema, exudate, or swelling.  Neck: supple/nontender.  No LAD, mass, or TM.  Carotid pulses 2+ bilaterally, without bruits. CV: RRR, no m/r/g.   LUNGS: CTA bilat, nonlabored resps, good aeration in all lung fields. ABD: soft, NT, ND, BS normal.  No hepatospenomegaly or mass.  No bruits. EXT: no clubbing, cyanosis, or edema.  Musculoskeletal: no joint swelling, erythema, warmth, or tenderness.  ROM of all joints intact.  No SI or facet joint tenderness. Skin - no sores or suspicious lesions or rashes or color changes Foot exam - no swelling, tenderness or skin or vascular lesions. Color and temperature is normal. Sensation is intact. Peripheral pulses are palpable. Toenails are normal.  Pertinent labs:  Lab Results  Component Value Date   TSH 2.18 04/17/2022   Lab Results  Component Value Date   WBC 5.7 04/17/2022   HGB 14.0 08/20/2022   HCT 40.9 04/17/2022   MCV 87.4 04/17/2022   PLT 253 04/17/2022   Lab Results  Component Value Date   CREATININE 1.10 02/10/2023   BUN 25 (H) 02/10/2023   NA 140 02/10/2023   K 4.1 02/10/2023   CL 103 02/10/2023   CO2 29 02/10/2023   Lab Results  Component Value Date   ALT 19 04/17/2022   AST 20 04/17/2022   ALKPHOS 70 03/20/2021   BILITOT 0.9 04/17/2022   Lab Results  Component Value Date   CHOL 113 10/30/2022   Lab Results  Component Value Date   HDL 42.10 10/30/2022   Lab Results  Component Value Date   LDLCALC 46 10/30/2022   Lab Results  Component Value Date   TRIG 123.0 10/30/2022  Lab Results  Component Value Date   CHOLHDL 3 10/30/2022   Lab Results  Component Value Date   PSA 1.04 04/17/2022   PSA 1.08 02/18/2021   PSA 0.89 11/24/2019    Lab Results  Component Value Date   HGBA1C 7.1 (H) 02/10/2023   ASSESSMENT AND PLAN:   #1 health maintenance exam: Reviewed age and gender appropriate health maintenance issues (prudent diet, regular exercise, health risks of tobacco and excessive alcohol, use of seatbelts, fire alarms in home, use of sunscreen).  Also reviewed age and gender appropriate health screening as well as vaccine recommendations. Vaccines: Up to date Labs: HP labs + Hba1c Prostate ca screening: PSA ordered Colon ca screening: recall as of 01/2023  #2 diabetes without complication. Doing well on Mounjaro 2.5 mg weekly, pioglitazone 45 mg a day, and metformin 1000 mg twice a day. Check hemoglobin A1c and urine microalbumin/creatinine today. Feet exam normal today.  #3 hypercholesterolemia, doing well on a atorvastatin 20 mg daily. Lipid panel and hepatic panel today.  4.  Chronic bilateral low back pain without sciatica. Okay to use NSAID approximately 4 or 5 times every couple of weeks. Physical therapy discussed but he opted to defer at this time.  #5 weight management. Since getting on Mounjaro 2.5 mg a week he has lost 16 pounds. Increase to 5 mg weekly.  #6 chronic renal insufficiency stage II/III. Trying to minimize NSAIDs is much as possible (see #4 above). Monitor creatinine and electrolytes today.  An After Visit Summary was printed and given to the patient.  FOLLOW UP:  Return in about 3 months (around 08/03/2023) for routine chronic illness f/u.  Signed:  Santiago Bumpers, MD           05/03/2023

## 2023-05-03 NOTE — Patient Instructions (Signed)

## 2023-05-04 LAB — COMPREHENSIVE METABOLIC PANEL
AG Ratio: 1.8 (calc) (ref 1.0–2.5)
ALT: 16 U/L (ref 9–46)
AST: 15 U/L (ref 10–35)
Albumin: 4.5 g/dL (ref 3.6–5.1)
Alkaline phosphatase (APISO): 66 U/L (ref 35–144)
BUN: 21 mg/dL (ref 7–25)
CO2: 28 mmol/L (ref 20–32)
Calcium: 9.7 mg/dL (ref 8.6–10.3)
Chloride: 100 mmol/L (ref 98–110)
Creat: 1.12 mg/dL (ref 0.70–1.30)
Globulin: 2.5 g/dL (calc) (ref 1.9–3.7)
Glucose, Bld: 92 mg/dL (ref 65–99)
Potassium: 3.7 mmol/L (ref 3.5–5.3)
Sodium: 138 mmol/L (ref 135–146)
Total Bilirubin: 0.9 mg/dL (ref 0.2–1.2)
Total Protein: 7 g/dL (ref 6.1–8.1)

## 2023-05-04 LAB — CBC
HCT: 39.7 % (ref 39.0–52.0)
Hemoglobin: 13.2 g/dL (ref 13.0–17.0)
MCHC: 33.3 g/dL (ref 30.0–36.0)
MCV: 90.9 fl (ref 78.0–100.0)
Platelets: 247 10*3/uL (ref 150.0–400.0)
RBC: 4.36 Mil/uL (ref 4.22–5.81)
RDW: 13.9 % (ref 11.5–15.5)
WBC: 12 10*3/uL — ABNORMAL HIGH (ref 4.0–10.5)

## 2023-05-04 LAB — MICROALBUMIN / CREATININE URINE RATIO
Creatinine,U: 56 mg/dL
Microalb Creat Ratio: 1.3 mg/g (ref 0.0–30.0)
Microalb, Ur: 0.8 mg/dL (ref 0.0–1.9)

## 2023-05-04 LAB — LIPID PANEL
Cholesterol: 111 mg/dL (ref <200)
HDL: 46 mg/dL (ref >=40)
LDL Cholesterol (Calc): 46 mg/dL (calc)
Non-HDL Cholesterol (Calc): 65 mg/dL (calc) (ref <130)
Total CHOL/HDL Ratio: 2.4 (calc) (ref <5.0)
Triglycerides: 103 mg/dL (ref <150)

## 2023-05-04 LAB — TSH: TSH: 2.31 mIU/L (ref 0.40–4.50)

## 2023-05-04 LAB — HEMOGLOBIN A1C: Hgb A1c MFr Bld: 6 % (ref 4.6–6.5)

## 2023-05-06 ENCOUNTER — Other Ambulatory Visit: Payer: Self-pay | Admitting: Family Medicine

## 2023-05-06 DIAGNOSIS — D72829 Elevated white blood cell count, unspecified: Secondary | ICD-10-CM

## 2023-07-01 ENCOUNTER — Encounter (INDEPENDENT_AMBULATORY_CARE_PROVIDER_SITE_OTHER): Payer: Self-pay

## 2023-07-08 ENCOUNTER — Other Ambulatory Visit: Payer: Self-pay | Admitting: Family Medicine

## 2023-07-13 ENCOUNTER — Other Ambulatory Visit: Payer: Self-pay | Admitting: Family Medicine

## 2023-07-14 NOTE — Telephone Encounter (Signed)
Patient is stating that pharmacy is stating Dr. Milinda Cave is denying refill for tirzepatide [facility]) 5 MG/0.5ML Pen .  Patient scheduled to come in 9/16 with Dr. Milinda Cave. Patient picked up script in July.  No refills left.  Please advise.

## 2023-07-16 NOTE — Telephone Encounter (Signed)
Patient called to follow up on status request for [facility]. I informed him a message has been sent back and waiting on provider response and let him know per policy its 48 hours for medication request.

## 2023-07-20 NOTE — Telephone Encounter (Signed)
Patient is calling again to check status on refill request for [facility].  At this point, with all the calls, messages from pharmacies he is very upset.  He thinks "we dont care about him and nothing matters because nothing is getting resolved"  Please call before end of day today. [phone]

## 2023-07-20 NOTE — Addendum Note (Signed)
Addended by: Filomena Jungling on: 07/20/2023 03:36 PM   Modules accepted: Orders

## 2023-07-21 MED ORDER — TIRZEPATIDE 5 MG/0.5ML ~~LOC~~ SOAJ: 5.0000 mg | SUBCUTANEOUS | 0 refills | Status: DC

## 2023-07-21 NOTE — Telephone Encounter (Signed)
Rx sent. Confirmed with pharmacy that pt only received 2ml (1 box) on 07/12

## 2023-07-21 NOTE — Addendum Note (Signed)
Addended by: Filomena Jungling on: 07/21/2023 09:39 AM   Modules accepted: Orders

## 2023-08-02 ENCOUNTER — Ambulatory Visit: Payer: BC Managed Care – PPO | Admitting: Family Medicine

## 2023-08-02 ENCOUNTER — Encounter: Payer: Self-pay | Admitting: Family Medicine

## 2023-08-02 VITALS — BP 138/78 | HR 78 | Wt 262.2 lb

## 2023-08-02 DIAGNOSIS — Z7689 Persons encountering health services in other specified circumstances: Secondary | ICD-10-CM

## 2023-08-02 DIAGNOSIS — N1831 Chronic kidney disease, stage 3a: Secondary | ICD-10-CM | POA: Diagnosis not present

## 2023-08-02 DIAGNOSIS — Z23 Encounter for immunization: Secondary | ICD-10-CM | POA: Diagnosis not present

## 2023-08-02 DIAGNOSIS — Z7984 Long term (current) use of oral hypoglycemic drugs: Secondary | ICD-10-CM | POA: Diagnosis not present

## 2023-08-02 DIAGNOSIS — D72829 Elevated white blood cell count, unspecified: Secondary | ICD-10-CM

## 2023-08-02 DIAGNOSIS — E1122 Type 2 diabetes mellitus with diabetic chronic kidney disease: Secondary | ICD-10-CM | POA: Diagnosis not present

## 2023-08-02 DIAGNOSIS — E119 Type 2 diabetes mellitus without complications: Secondary | ICD-10-CM

## 2023-08-02 LAB — POCT GLYCOSYLATED HEMOGLOBIN (HGB A1C)
HbA1c POC (<> result, manual entry): 5.9 % (ref 4.0–5.6)
HbA1c, POC (controlled diabetic range): 5.9 % (ref 0.0–7.0)
HbA1c, POC (prediabetic range): 5.9 % (ref 5.7–6.4)
Hemoglobin A1C: 5.9 % — AB (ref 4.0–5.6)

## 2023-08-02 MED ORDER — BLOOD GLUCOSE MONITOR KIT: PACK | 0 refills | Status: AC

## 2023-08-02 MED ORDER — TIRZEPATIDE 7.5 MG/0.5ML ~~LOC~~ SOAJ: 7.5000 mg | SUBCUTANEOUS | 3 refills | Status: DC

## 2023-08-02 MED ORDER — POTASSIUM CITRATE ER 15 MEQ (1620 MG) PO TBCR: 1.0000 | EXTENDED_RELEASE_TABLET | Freq: Two times a day (BID) | ORAL | 3 refills | Status: DC

## 2023-08-02 NOTE — Progress Notes (Signed)
OFFICE VISIT  08/02/2023  CC:  Chief Complaint  Patient presents with   Medical Management of Chronic Issues    Patient is a 57 y.o. male who presents for diabetes, weight management, CRI III. A/P as of last visit: "1 diabetes without complication. Doing well on Mounjaro 2.5 mg weekly, pioglitazone 45 mg a day, and metformin 1000 mg twice a day. Check hemoglobin A1c and urine microalbumin/creatinine today. Feet exam normal today.   #2 hypercholesterolemia, doing well on a atorvastatin 20 mg daily. Lipid panel and hepatic panel today.   3.  Chronic bilateral low back pain without sciatica. Okay to use NSAID approximately 4 or 5 times every couple of weeks. Physical therapy discussed but he opted to defer at this time.   #4 weight management. Since getting on Mounjaro 2.5 mg a week he has lost 16 pounds. Increase to 5 mg weekly.   #5 chronic renal insufficiency stage II/III. Trying to minimize NSAIDs is much as possible (see #4 above). Monitor creatinine and electrolytes today."  INTERIM HX: Feeling well, continues good diet and activity. No s/e's from [facility]. As he purposefully loses weight his joints/back feel better.  ROS as above, plus--> no fevers, no CP, no SOB, no wheezing, no cough, no dizziness, no HAs, no rashes, no melena/hematochezia.  No polyuria or polydipsia.   No focal weakness, paresthesias, or tremors.  No acute vision or hearing abnormalities.  No dysuria or unusual/new urinary urgency or frequency.  No recent changes in lower legs. No n/v/d or abd pain.  No palpitations.    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   Chronic renal insufficiency, stage 2 (mild)    borderline II/III: nephrol eval 10/2020->suspected cause is combo  of recurrent kidney stones, polycystic kidney dz, HTN, and DM.   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  No mass effect or enhancing lesions as of 2019 MRI.  Renal u/s kidneys about 14 cm bilat, multiple benign cysts, 2 kidney stones on L. Neph plans f/u CT 2025 to monitor prog of kidney size.   Constipation    Diabetes mellitus without complication (HCC) 10/2017   Dx'd at [facility] CPE   ED (erectile dysfunction)    Sildenafil trial by urol 05/2018.   Gallstones    asymptomatic   GERD (gastroesophageal reflux disease)    Dr. Dulce Sellar   Heart murmur    Found at age 57, w/u unrevealing.  No cardiac symptoms.   Hypercholesterolemia    Kidney stones    [facility]: takes indapamide 1.25mg  qd + K cit bid.   Metabolic stone dz: renal leak hypercalcuria.   Obesity, Class I, BMI 30-34.9    OSA (obstructive sleep apnea) approx 2008   was put on CPAP but says he fell out of the habit of using it due to "laziness".  Has CPAP machine but doesn't use it.   Osteoarthritis, multiple sites    Hands and right foot and both knees.  Ortho started meloxicam 08/2018.   Primary osteoarthritis of both knees    Vsco supplementation helpful in ths past.   Prostate cancer screening    Urol did DRE (wnl) and PSA 05/24/18.  PSA 0.89 Nov 24, 2019 at [facility]. 02/18/21 PSA 1.1 at urol.   Simple hepatic cyst    many, tiny    Past Surgical History:  Procedure Laterality Date   ANTERIOR CRUCIATE LIGAMENT REPAIR  1991   ACL and PCL left knee   COLONOSCOPY  02/01/2018   Tubular adenoma, repeat 5 years    ETT  03/2015   NORMAL   US RENAL/AORTA     14cm bilaterally with multiple cysts mostly benign and 2 kideny stones on the left side. Repeat CT in 2025    Outpatient Medications Prior to Visit  Medication Sig Dispense Refill   atorvastatin (LIPITOR) 20 MG tablet TAKE 1 TABLET BY MOUTH DAILY 90 tablet 3   indapamide  (LOZOL) 1.25 MG tablet Take 1.25 mg by mouth daily.     metFORMIN (GLUCOPHAGE) 1000 MG tablet TAKE 1 TABLET BY MOUTH TWICE  DAILY WITH A MEAL 180 tablet 3   omeprazole (PRILOSEC) 40 MG capsule TAKE 1 CAPSULE BY MOUTH DAILY 90 capsule 3   pioglitazone (ACTOS) 45 MG tablet Take 1 tablet (45 mg total) by mouth daily. 90 tablet 3   tirzepatide (MOUNJARO) 5 MG/0.5ML Pen Inject 5 mg into the skin once a week. 2 mL 0   Potassium Citrate 15 MEQ (1620 MG) TBCR Take 1 tablet by mouth 2 (two) times daily. 180 tablet 3   No facility-administered medications prior to visit.    Allergies  Allergen Reactions   Amoxicillin Rash   Ciprofloxacin Hcl Rash    Review of Systems As per HPI  PE:    08/02/2023    3:20 PM 05/03/2023    3:30 PM 01/21/2023    3:10 PM  Vitals with BMI  Height  6\' 6"  6\' 4"   Weight 262 lbs 3 oz 270 lbs 6 oz 270 lbs  BMI  31.25 32.88  Systolic 138 137   Diastolic 78 81   Pulse 78 86      Physical Exam  Gen: Alert, well appearing.  Patient is oriented to person, place, time, and situation. AFFECT: pleasant, lucid thought and speech. No further exam today  LABS:  Last CBC Lab Results  Component Value Date   WBC 12.0 (H) 05/03/2023   HGB 13.2 05/03/2023   HCT 39.7 05/03/2023   MCV 90.9 05/03/2023   MCH 30.6 04/17/2022   RDW 13.9 05/03/2023   PLT 247.0 05/03/2023   Last metabolic panel Lab Results  Component Value Date   GLUCOSE 92 05/03/2023   NA 138 05/03/2023   K 3.7 05/03/2023   CL 100 05/03/2023   CO2 28 05/03/2023   BUN 21 05/03/2023   CREATININE 1.12 05/03/2023   GFR 75.50 02/10/2023   CALCIUM 9.7 05/03/2023   PROT 7.0 05/03/2023   ALBUMIN 4.5 08/20/2022   BILITOT 0.9 05/03/2023   ALKPHOS 70 03/20/2021   AST 15 05/03/2023   ALT 16 05/03/2023   ANIONGAP 12 02/17/2020   Last lipids Lab Results  Component Value Date   CHOL 111 05/03/2023   HDL 46 05/03/2023   LDLCALC 46 05/03/2023   TRIG 103 05/03/2023   CHOLHDL 2.4 05/03/2023   Last  hemoglobin A1c Lab Results  Component Value Date   HGBA1C 5.9 (A) 08/02/2023   HGBA1C 5.9 08/02/2023   HGBA1C 5.9 08/02/2023   HGBA1C 5.9 08/02/2023   Last thyroid functions Lab Results  Component Value Date   TSH 2.31 05/03/2023   Last vitamin D Lab Results  Component Value Date  VD25OH 31.6 10/18/2020   IMPRESSION AND PLAN:  #1 diabetes without complication.  Good control. Point-of-care hemoglobin A1c today was 5.9%. Doing well on Mounjaro 5 mg weekly, pioglitazone 45 mg a day and metformin 1000 mg twice daily. Will discontinue metformin at this time. We are increasing Mounjaro to 7.5 mg weekly.  Continue pioglitazone 45 mg a day.   #2 hypercholesterolemia, doing well on a atorvastatin 20 mg daily. LDL was 46 about 3 months ago. Repeat lipids 3 months.   #3 weight management. Since getting on Mounjaro 5 mg a week he has lost 8 more pounds. Increase to 7.5mg  weekly.   #4 chronic renal insufficiency stage II/III. Trying to minimize NSAIDs is much as possible. Monitor creatinine and electrolytes today.  #5 leukocytosis. Last visit his white blood cell count was a little bit elevated at 12,000. Monitor CBC with differential today to see if any trend.  An After Visit Summary was printed and given to the patient.  FOLLOW UP: 3 mo Next CPE June 2025 Signed:  Santiago Bumpers, MD           08/02/2023

## 2023-08-03 LAB — BASIC METABOLIC PANEL
BUN: 28 mg/dL — ABNORMAL HIGH (ref 6–23)
CO2: 29 meq/L (ref 19–32)
Calcium: 9.1 mg/dL (ref 8.4–10.5)
Chloride: 103 meq/L (ref 96–112)
Creatinine, Ser: 1.18 mg/dL (ref 0.40–1.50)
GFR: 69.17 mL/min (ref >60.00)
Glucose, Bld: 114 mg/dL — ABNORMAL HIGH (ref 70–99)
Potassium: 3.9 meq/L (ref 3.5–5.1)
Sodium: 140 meq/L (ref 135–145)

## 2023-08-03 LAB — CBC WITH DIFFERENTIAL/PLATELET
Basophils Absolute: 0 10*3/uL (ref 0.0–0.1)
Basophils Relative: 0.5 % (ref 0.0–3.0)
Eosinophils Absolute: 0.2 10*3/uL (ref 0.0–0.7)
Eosinophils Relative: 2.8 % (ref 0.0–5.0)
HCT: 40.4 % (ref 39.0–52.0)
Hemoglobin: 13.5 g/dL (ref 13.0–17.0)
Lymphocytes Relative: 26.4 % (ref 12.0–46.0)
Lymphs Abs: 1.4 10*3/uL (ref 0.7–4.0)
MCHC: 33.3 g/dL (ref 30.0–36.0)
MCV: 91.4 fl (ref 78.0–100.0)
Monocytes Absolute: 0.4 10*3/uL (ref 0.1–1.0)
Monocytes Relative: 7 % (ref 3.0–12.0)
Neutro Abs: 3.4 10*3/uL (ref 1.4–7.7)
Neutrophils Relative %: 63.3 % (ref 43.0–77.0)
Platelets: 256 10*3/uL (ref 150.0–400.0)
RBC: 4.42 Mil/uL (ref 4.22–5.81)
RDW: 14.2 % (ref 11.5–15.5)
WBC: 5.5 10*3/uL (ref 4.0–10.5)

## 2023-08-15 ENCOUNTER — Other Ambulatory Visit: Payer: Self-pay | Admitting: Family Medicine

## 2023-08-16 NOTE — Telephone Encounter (Signed)
Patient states his pharmacy sent him a message they could not fill prescription and to contact our office. He denied calling pharmacy and doesn't know the reason for not filling medication. Patient states he has not picked up in meds and is due for injection on Wednesday.  tirzepatide [facility]) 7.5 MG/0.5ML Pen   He said that last month he was going "round and round" with our office, the pharmacy and his insurance and would appreciate someone to figure this out quickly so that he won't have to experience what he did last month.  Please call [phone].

## 2023-08-17 NOTE — Telephone Encounter (Signed)
Patient called upset about his medication(Mounjaro) not being filled. He called yesterday and states he requested a call. Patient was informed the message was indeed sent, however it has not been addressed by a CMA or Dr. Milinda Cave and per policy its 48-72 hours for medication request to be handled. Patient then stated he expects a call tomorrow before noon or he will have to find another provider being as he does not care about the policy and he doesn't feel important.

## 2023-08-18 ENCOUNTER — Other Ambulatory Visit: Payer: Self-pay

## 2023-08-18 DIAGNOSIS — N182 Chronic kidney disease, stage 2 (mild): Secondary | ICD-10-CM | POA: Diagnosis not present

## 2023-08-18 DIAGNOSIS — E785 Hyperlipidemia, unspecified: Secondary | ICD-10-CM | POA: Diagnosis not present

## 2023-08-18 DIAGNOSIS — E1122 Type 2 diabetes mellitus with diabetic chronic kidney disease: Secondary | ICD-10-CM | POA: Diagnosis not present

## 2023-08-18 DIAGNOSIS — Q6102 Congenital multiple renal cysts: Secondary | ICD-10-CM | POA: Diagnosis not present

## 2023-08-18 MED ORDER — TIRZEPATIDE 7.5 MG/0.5ML ~~LOC~~ SOAJ: 7.5000 mg | SUBCUTANEOUS | 3 refills | Status: DC

## 2023-08-18 NOTE — Telephone Encounter (Signed)
Spoke with pt and apologized for the mix up. Meds sent and pt aware.

## 2023-10-19 DIAGNOSIS — Z125 Encounter for screening for malignant neoplasm of prostate: Secondary | ICD-10-CM | POA: Diagnosis not present

## 2023-10-19 DIAGNOSIS — Q6102 Congenital multiple renal cysts: Secondary | ICD-10-CM | POA: Diagnosis not present

## 2023-10-19 DIAGNOSIS — N200 Calculus of kidney: Secondary | ICD-10-CM | POA: Diagnosis not present

## 2023-10-19 DIAGNOSIS — N5201 Erectile dysfunction due to arterial insufficiency: Secondary | ICD-10-CM | POA: Diagnosis not present

## 2023-11-09 ENCOUNTER — Other Ambulatory Visit: Payer: Self-pay | Admitting: Family Medicine

## 2023-11-20 ENCOUNTER — Other Ambulatory Visit: Payer: Self-pay | Admitting: Family Medicine

## 2023-12-13 ENCOUNTER — Other Ambulatory Visit: Payer: Self-pay | Admitting: Family Medicine

## 2023-12-14 ENCOUNTER — Other Ambulatory Visit: Payer: Self-pay | Admitting: Family Medicine

## 2023-12-27 ENCOUNTER — Other Ambulatory Visit: Payer: Self-pay | Admitting: Family Medicine

## 2024-01-02 ENCOUNTER — Other Ambulatory Visit: Payer: Self-pay | Admitting: Family Medicine

## 2024-01-03 ENCOUNTER — Other Ambulatory Visit: Payer: Self-pay | Admitting: Family Medicine

## 2024-01-03 NOTE — Telephone Encounter (Unsigned)
Copied from CRM [id number]. Topic: Clinical - Medication Refill >> Jan 03, 2024  5:02 PM Tiffany H wrote: Most Recent Primary Care Visit:  Provider: Jeoffrey Massed  Department: [facility]  Visit Type: OFFICE VISIT  Date: 08/02/2023  Medication: pioglitazone (ACTOS) 45 MG tablet  Has the patient contacted their pharmacy? Yes (Agent: If no, request that the patient contact the pharmacy for the refill. If patient does not wish to contact the pharmacy document the reason why and proceed with request.) (Agent: If yes, when and what did the pharmacy advise?)  Is this the correct pharmacy for this prescription? Yes If no, delete pharmacy and type the correct one.  This is the patient's preferred pharmacy:  [id number] - [place], [place] - [address] [place] [address] Phone: [phone] Fax: [phone]  Has the prescription been filled recently? No.   Is the patient out of the medication? Yes  Has the patient been seen for an appointment in the last year OR does the patient have an upcoming appointment? Yes.   Can we respond through MyChart? Yes.   Agent: Please be advised that Rx refills may take up to 3 business days. We ask that you follow-up with your pharmacy.

## 2024-01-04 MED ORDER — PIOGLITAZONE HCL 45 MG PO TABS: 45.0000 mg | ORAL_TABLET | Freq: Every day | ORAL | 0 refills | Status: DC

## 2024-01-13 NOTE — Patient Instructions (Signed)

## 2024-01-14 ENCOUNTER — Ambulatory Visit: Payer: BC Managed Care – PPO | Admitting: Family Medicine

## 2024-01-14 ENCOUNTER — Other Ambulatory Visit: Payer: BC Managed Care – PPO

## 2024-01-14 VITALS — BP 122/77 | HR 72 | Ht 78.0 in | Wt 264.0 lb

## 2024-01-14 DIAGNOSIS — Z7984 Long term (current) use of oral hypoglycemic drugs: Secondary | ICD-10-CM | POA: Diagnosis not present

## 2024-01-14 DIAGNOSIS — E7800 Pure hypercholesterolemia, unspecified: Secondary | ICD-10-CM

## 2024-01-14 DIAGNOSIS — Z7689 Persons encountering health services in other specified circumstances: Secondary | ICD-10-CM | POA: Diagnosis not present

## 2024-01-14 DIAGNOSIS — N182 Chronic kidney disease, stage 2 (mild): Secondary | ICD-10-CM

## 2024-01-14 DIAGNOSIS — E119 Type 2 diabetes mellitus without complications: Secondary | ICD-10-CM

## 2024-01-14 LAB — POCT GLYCOSYLATED HEMOGLOBIN (HGB A1C)
HbA1c POC (<> result, manual entry): 5.9 % (ref 4.0–5.6)
HbA1c, POC (controlled diabetic range): 5.9 % (ref 0.0–7.0)
HbA1c, POC (prediabetic range): 5.9 % (ref 5.7–6.4)
Hemoglobin A1C: 5.9 % — AB (ref 4.0–5.6)

## 2024-01-14 NOTE — Progress Notes (Signed)
 OFFICE VISIT  01/14/2024  CC:  Chief Complaint  Patient presents with   Medical Management of Chronic Issues    Patient is a 58 y.o. male who presents for 33-month follow-up diabetes, hypercholesterolemia, and chronic renal insufficiency. A/P as of last visit: "#1 diabetes without complication.  Good control. Point-of-care hemoglobin A1c today was 5.9%. Doing well on Mounjaro 5 mg weekly, pioglitazone 45 mg a day and metformin 1000 mg twice daily. Will discontinue metformin at this time. We are increasing Mounjaro to 7.5 mg weekly.  Continue pioglitazone 45 mg a day.   #2 hypercholesterolemia, doing well on a atorvastatin 20 mg daily. LDL was 46 about 3 months ago. Repeat lipids 3 months.   #3 weight management. Since getting on Mounjaro 5 mg a week he has lost 8 more pounds. Increase to 7.5mg  weekly.   #4 chronic renal insufficiency stage II/III. Trying to minimize NSAIDs is much as possible. Monitor creatinine and electrolytes today.   #5 leukocytosis. Last visit his white blood cell count was a little bit elevated at 12,000. Monitor CBC with differential today to see if any trend."  INTERIM HX: Adrian Cook feels well. Not exercising all that much.  Diet is pretty good. Tolerating Mounjaro 7.5 mg weekly.  He has a little bit of constipation but feels like he is staying on top of it with Adrian Cook daily.  ROS as above, plus--> no fevers, no CP, no SOB, no wheezing, no cough, no dizziness, no HAs, no rashes, no melena/hematochezia.  No polyuria or polydipsia.  No myalgias or arthralgias.  No focal weakness, paresthesias, or tremors.  No acute vision or hearing abnormalities.  No dysuria or unusual/new urinary urgency or frequency.  No recent changes in lower legs. No n/v/d or abd pain.  No palpitations.     Past Medical History:  Diagnosis Date   Atypical chest pain 2016   ETT 03/2015 NORMAL   Chronic low back pain    Chronic pain of both feet    lateral aspects-->ortho dx'd  him with acquired cavovarus deformity bilat--recommended show inserts + consider custom orthotics.   Chronic renal insufficiency, stage 2 (mild)    borderline II/III: nephrol eval 10/2020->suspected cause is combo of recurrent kidney stones, polycystic kidney dz, HTN, and DM.   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  No mass effect or enhancing lesions as of 2019 MRI.  Renal u/s kidneys about 14 cm bilat, multiple benign cysts, 2 kidney stones on L. Neph plans f/u CT 2025 to monitor prog of kidney size.   Constipation    Diabetes mellitus without complication (HCC) 10/2017   Dx'd at [facility] CPE   ED (erectile dysfunction)    Sildenafil trial by urol 05/2018.   Gallstones    asymptomatic   GERD (gastroesophageal reflux disease)    Dr. Dulce Cook   Heart murmur    Found at age 24, w/u unrevealing.  No cardiac symptoms.   Hypercholesterolemia    Kidney stones    [facility]: takes indapamide 1.25mg  qd + K cit bid.   Metabolic stone dz: renal leak hypercalcuria.   Obesity, Class I, BMI 30-34.9    OSA (obstructive sleep apnea) approx 2008   was put on CPAP but says he fell out of the habit of using it due to "laziness".  Has CPAP machine but doesn't use it.   Osteoarthritis, multiple sites    Hands and right  foot and both knees.  Ortho started meloxicam 08/2018.   Primary osteoarthritis of both knees    Vsco supplementation helpful in ths past.   Prostate cancer screening    Urol did DRE (wnl) and PSA 05/24/18. PSA 0.89 Nov 24, 2019 at [facility]. 02/18/21 PSA 1.1 at urol.   Simple hepatic cyst    many, tiny    Past Surgical History:  Procedure Laterality Date   ANTERIOR CRUCIATE LIGAMENT REPAIR  1991   ACL and PCL left knee   COLONOSCOPY  02/01/2018   Tubular adenoma, repeat 5 years    ETT  03/2015   NORMAL   US RENAL/AORTA     14cm bilaterally with multiple cysts mostly benign and 2  kideny stones on the left side. Repeat CT in 2025    Outpatient Medications Prior to Visit  Medication Sig Dispense Refill   ACCU-CHEK GUIDE TEST test strip USED TO CHECK BLOOD SUGAR 3 TIMES A WEEK     Accu-Chek Softclix Lancets lancets USE TO CHECK SUGAR 3 TIMES WEEKLY.     atorvastatin (LIPITOR) 20 MG tablet TAKE 1 TABLET BY MOUTH DAILY 90 tablet 3   blood glucose meter kit and supplies KIT Use to check glucose three (3) times weekly. 1 each 0   indapamide (LOZOL) 1.25 MG tablet Take 1.25 mg by mouth daily.     omeprazole (PRILOSEC) 40 MG capsule TAKE 1 CAPSULE BY MOUTH DAILY 90 capsule 3   Potassium Citrate 15 MEQ (1620 MG) TBCR Take 1 tablet by mouth 2 (two) times daily. 180 tablet 3   tirzepatide (MOUNJARO) 7.5 MG/0.5ML Pen Inject 7.5 mg into the skin once a week. 6 mL 3   pioglitazone (ACTOS) 45 MG tablet Take 1 tablet (45 mg total) by mouth daily. 30 tablet 0   metFORMIN (GLUCOPHAGE) 1000 MG tablet TAKE 1 TABLET BY MOUTH TWICE  DAILY WITH A MEAL 180 tablet 3   No facility-administered medications prior to visit.    Allergies  Allergen Reactions   Amoxicillin Rash   Ciprofloxacin Hcl Rash    Review of Systems As per HPI  PE:    01/14/2024    2:54 PM 08/02/2023    3:20 PM 05/03/2023    3:30 PM  Vitals with BMI  Height 6\' 6"   6\' 6"   Weight 264 lbs 262 lbs 3 oz 270 lbs 6 oz  BMI 30.51  31.25  Systolic 122 138 161  Diastolic 77 78 81  Pulse 72 78 86     Physical Exam  Gen: Alert, well appearing.  Patient is oriented to person, place, time, and situation. No further exam today  LABS:  Last CBC Lab Results  Component Value Date   WBC 5.5 08/02/2023   HGB 13.5 08/02/2023   HCT 40.4 08/02/2023   MCV 91.4 08/02/2023   MCH 30.6 04/17/2022   RDW 14.2 08/02/2023   PLT 256.0 08/02/2023   Last metabolic panel Lab Results  Component Value Date   GLUCOSE 114 (H) 08/02/2023   NA 140 08/02/2023   K 3.9 08/02/2023   CL 103 08/02/2023   CO2 29 08/02/2023   BUN 28  (H) 08/02/2023   CREATININE 1.18 08/02/2023   GFR 69.17 08/02/2023   CALCIUM 9.1 08/02/2023   PROT 7.0 05/03/2023   ALBUMIN 4.5 08/20/2022   BILITOT 0.9 05/03/2023   ALKPHOS 70 03/20/2021   AST 15 05/03/2023   ALT 16 05/03/2023   ANIONGAP 12 02/17/2020   Last lipids Lab Results  Component Value Date   CHOL 111 05/03/2023   HDL 46 05/03/2023   LDLCALC 46 05/03/2023   TRIG 103 05/03/2023   CHOLHDL 2.4 05/03/2023   Last hemoglobin A1c Lab Results  Component Value Date   HGBA1C 5.9 (A) 01/14/2024   HGBA1C 5.9 01/14/2024   HGBA1C 5.9 01/14/2024   HGBA1C 5.9 01/14/2024   Last thyroid functions Lab Results  Component Value Date   TSH 2.31 05/03/2023   IMPRESSION AND PLAN:  1 diabetes without complication.  Good control. Point-of-care hemoglobin A1c today was 5.9%, same as last visit. Discontinue pioglitazone. He has 3 more 7.5 mg Mounjaro injections and then we will increase to 10 mg weekly injection.  He will call when he needs the prescription.   #2 hypercholesterolemia, doing well on a atorvastatin 20 mg daily. LDL was 46 about 6 months ago. Lipid panel and hepatic panel today.   #3 weight management. Doing well on Mounjaro but weight loss has plateaued over the last couple of months. Increase dose as per #1 above. He we will also continue to work on increasing exercise and improving diet.  #4 chronic renal insufficiency stage II/III. Trying to minimize NSAIDs is much as possible. Monitor creatinine and electrolytes today.  An After Visit Summary was printed and given to the patient.  FOLLOW UP: Return in about 3 months (around 04/12/2024) for routine chronic illness f/u. Next CPE June 2025 Signed:  Santiago Bumpers, MD           01/14/2024

## 2024-01-15 LAB — COMPREHENSIVE METABOLIC PANEL
AG Ratio: 1.9 (calc) (ref 1.0–2.5)
ALT: 18 U/L (ref 9–46)
AST: 17 U/L (ref 10–35)
Albumin: 4.5 g/dL (ref 3.6–5.1)
Alkaline phosphatase (APISO): 77 U/L (ref 35–144)
BUN: 25 mg/dL (ref 7–25)
CO2: 29 mmol/L (ref 20–32)
Calcium: 9.8 mg/dL (ref 8.6–10.3)
Chloride: 101 mmol/L (ref 98–110)
Creat: 1.11 mg/dL (ref 0.70–1.30)
Globulin: 2.4 g/dL (ref 1.9–3.7)
Glucose, Bld: 84 mg/dL (ref 65–99)
Potassium: 3.6 mmol/L (ref 3.5–5.3)
Sodium: 138 mmol/L (ref 135–146)
Total Bilirubin: 1.1 mg/dL (ref 0.2–1.2)
Total Protein: 6.9 g/dL (ref 6.1–8.1)

## 2024-01-15 LAB — LIPID PANEL
Cholesterol: 106 mg/dL (ref <200)
HDL: 45 mg/dL (ref >=40)
LDL Cholesterol (Calc): 47 mg/dL
Non-HDL Cholesterol (Calc): 61 mg/dL (ref <130)
Total CHOL/HDL Ratio: 2.4 (calc) (ref <5.0)
Triglycerides: 67 mg/dL (ref <150)

## 2024-01-16 ENCOUNTER — Encounter: Payer: Self-pay | Admitting: Family Medicine

## 2024-02-03 ENCOUNTER — Other Ambulatory Visit: Payer: Self-pay | Admitting: Family Medicine

## 2024-02-03 NOTE — Telephone Encounter (Signed)
 Copied from CRM [id number]. Topic: Clinical - Medication Refill >> Feb 03, 2024  9:53 AM Pascal Lux wrote: Most Recent Primary Care Visit:  Provider: Jeoffrey Massed  Department: [facility]  Visit Type: OFFICE VISIT  Date: 01/14/2024  Medication: tirzepatide [facility]) 7.5 MG/0.5ML Pen [[id number]] (Dosage increase from 7.5 to 10MG )  Has the patient contacted their pharmacy? No (Agent: If no, request that the patient contact the pharmacy for the refill. If patient does not wish to contact the pharmacy document the reason why and proceed with request.) (Agent: If yes, when and what did the pharmacy advise?)  Is this the correct pharmacy for this prescription? Yes If no, delete pharmacy and type the correct one.  This is the patient's preferred pharmacy:  [id number] - [place], [place] - [address] [place] [address] Phone: [phone] Fax: [phone]    Has the prescription been filled recently? No  Is the patient out of the medication? Yes  Has the patient been seen for an appointment in the last year OR does the patient have an upcoming appointment? Yes  Can we respond through MyChart? Yes  Agent: Please be advised that Rx refills may take up to 3 business days. We ask that you follow-up with your pharmacy.

## 2024-02-04 ENCOUNTER — Other Ambulatory Visit: Payer: Self-pay | Admitting: Family Medicine

## 2024-02-07 ENCOUNTER — Other Ambulatory Visit: Payer: Self-pay

## 2024-02-07 DIAGNOSIS — D3132 Benign neoplasm of left choroid: Secondary | ICD-10-CM | POA: Diagnosis not present

## 2024-02-07 DIAGNOSIS — E119 Type 2 diabetes mellitus without complications: Secondary | ICD-10-CM | POA: Diagnosis not present

## 2024-02-07 DIAGNOSIS — D3131 Benign neoplasm of right choroid: Secondary | ICD-10-CM | POA: Diagnosis not present

## 2024-02-07 LAB — HM DIABETES EYE EXAM

## 2024-02-07 MED ORDER — TIRZEPATIDE 10 MG/0.5ML ~~LOC~~ SOAJ
10.0000 mg | SUBCUTANEOUS | 1 refills | Status: DC
Start: 2024-02-07 — End: 2024-04-13

## 2024-02-07 MED ORDER — TIRZEPATIDE 10 MG/0.5ML ~~LOC~~ SOAJ: 10.0000 mg | SUBCUTANEOUS | 3 refills | Status: DC

## 2024-02-07 NOTE — Telephone Encounter (Signed)
 Pt was last seen 01/14/24 for RCI; last Mounjaro refill 7.5mg  dose on 08/18/23 (23ml,3). Requesting dose increase to 10mg .

## 2024-02-07 NOTE — Telephone Encounter (Signed)
 Yes 90d with 1 RF ok

## 2024-04-13 ENCOUNTER — Encounter: Payer: Self-pay | Admitting: Family Medicine

## 2024-04-13 ENCOUNTER — Ambulatory Visit: Payer: BC Managed Care – PPO | Admitting: Family Medicine

## 2024-04-13 VITALS — BP 115/74 | HR 89 | Temp 98.1°F | Ht 78.0 in | Wt 260.4 lb

## 2024-04-13 DIAGNOSIS — Z79899 Other long term (current) drug therapy: Secondary | ICD-10-CM | POA: Diagnosis not present

## 2024-04-13 DIAGNOSIS — Z7689 Persons encountering health services in other specified circumstances: Secondary | ICD-10-CM

## 2024-04-13 DIAGNOSIS — E1121 Type 2 diabetes mellitus with diabetic nephropathy: Secondary | ICD-10-CM

## 2024-04-13 LAB — POCT GLYCOSYLATED HEMOGLOBIN (HGB A1C)
HbA1c POC (<> result, manual entry): 6.5 % (ref 4.0–5.6)
HbA1c, POC (controlled diabetic range): 6.5 % (ref 0.0–7.0)
HbA1c, POC (prediabetic range): 6.5 % — AB (ref 5.7–6.4)
Hemoglobin A1C: 6.5 % — AB (ref 4.0–5.6)

## 2024-04-13 MED ORDER — TIRZEPATIDE 10 MG/0.5ML ~~LOC~~ SOAJ: 10.0000 mg | SUBCUTANEOUS | 1 refills | Status: AC

## 2024-04-13 MED ORDER — ATORVASTATIN CALCIUM 20 MG PO TABS: 20.0000 mg | ORAL_TABLET | Freq: Every day | ORAL | 3 refills | Status: AC

## 2024-04-13 MED ORDER — OMEPRAZOLE 40 MG PO CPDR: 40.0000 mg | DELAYED_RELEASE_CAPSULE | Freq: Every day | ORAL | 3 refills | Status: AC

## 2024-04-13 MED ORDER — POTASSIUM CITRATE ER 15 MEQ (1620 MG) PO TBCR: 1.0000 | EXTENDED_RELEASE_TABLET | Freq: Two times a day (BID) | ORAL | 3 refills | Status: AC

## 2024-04-13 NOTE — Progress Notes (Signed)
 OFFICE VISIT  04/13/2024  CC:  Chief Complaint  Patient presents with   Medical Management of Chronic Issues    3 month follow up    Patient is a 58 y.o. male who presents for 39-month follow-up diabetes, hyperlipidemia, chronic renal insufficiency, and weight management. A/P as of last visit: "1 diabetes without complication.  Good control. Point-of-care hemoglobin A1c today was 5.9%, same as last visit. Discontinue pioglitazone . He has 3 more 7.5 mg Mounjaro  injections and then we will increase to 10 mg weekly injection.  He will call when he needs the prescription.   #2 hypercholesterolemia, doing well on a atorvastatin  20 mg daily. LDL was 46 about 6 months ago. Lipid panel and hepatic panel today.   #3 weight management. Doing well on Mounjaro  but weight loss has plateaued over the last couple of months. Increase dose as per #1 above. He we will also continue to work on increasing exercise and improving diet.   #4 chronic renal insufficiency stage II/III. Trying to minimize NSAIDs is much as possible. Monitor creatinine and electrolytes today."  INTERIM HX: Adrian Cook states he feels flulike symptoms for about a day after he does his Mounjaro  10 mg injection. Otherwise he is doing well.  He takes omeprazole  40 mg every day and has done so for several years for GERD. When he misses a day of medication he does get some mild GER symptoms. He is concerned about possible long-term effects of PPIs.    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   Chronic renal insufficiency, stage 2 (mild)    borderline II/III: nephrol eval 10/2020->suspected cause is combo of recurrent kidney stones, polycystic kidney dz, HTN, and DM.   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  No mass effect or enhancing lesions as of 2019 MRI.  Renal u/s kidneys about 14 cm bilat, multiple benign cysts, 2 kidney stones on L. Neph plans f/u CT 2025 to monitor prog of kidney size.   Constipation    Diabetes mellitus without complication (HCC) 10/2017   [facility]x'd at [facility] C[facility]   ED (erectile dysfunction)    Sildenafil trial by urol 05/2018.   Gallstones    asymptomatic   GERD (gastroesophageal reflux disease)    Dr. Kimble Pennant   Heart murmur    Found at age 58, w/u unrevealing.  No cardiac symptoms.   Hypercholesterolemia    Kidney stones    [facility]: takes indapamide  1.25mg  qd + K cit bid.   Metabolic stone dz: renal leak hypercalcuria.   Obesity, Class I, BMI 30-34.9    OSA (obstructive sleep apnea) approx 2008   was put on CPAP but says he fell out of the habit of using it due to "laziness".  Has CPAP machine but doesn't use it.   Osteoarthritis, multiple sites    Hands and right foot and both knees.  Ortho started meloxicam  08/2018.   Primary osteoarthritis of both knees    Vsco supplementation helpful in ths past.   Prostate cancer screening    Urol did DRE (wnl) and PSA 05/24/18. PSA 0.89 Nov 24, 2019 at [facility]. 02/18/21 PSA 1.1 at urol.   Simple  hepatic cyst    many, tiny    Past Surgical History:  Procedure Laterality Date   ANTERIOR CRUCIATE LIGAMENT REPAIR  1991   ACL and PCL left knee   COLONOSCOPY  02/01/2018   Tubular adenoma, repeat 5 years    ETT  03/2015   NORMAL   US  RENAL/AORTA     14cm bilaterally with multiple cysts mostly benign and 2 kideny stones on the left side. Repeat CT in 2025    Outpatient Medications Prior to Visit  Medication Sig Dispense Refill   ACCU-CHEK GUIDE TEST test strip USED TO CHECK BLOOD SUGAR 3 TIMES A WEEK     Accu-Chek Softclix Lancets lancets USE TO CHECK SUGAR 3 TIMES WEEKLY.     blood glucose meter kit and supplies  KIT Use to check glucose three (3) times weekly. 1 each 0   indapamide  (LOZOL ) 1.25 MG tablet Take 1.25 mg by mouth daily.     tirzepatide  (MOUNJARO ) 10 MG/0.5ML Pen Inject 10 mg into the skin once a week. 6 mL 1   atorvastatin  (LIPITOR) 20 MG tablet TAKE 1 TABLET BY MOUTH DAILY 90 tablet 3   omeprazole  (PRILOSEC) 40 MG capsule TAKE 1 CAPSULE BY MOUTH DAILY 90 capsule 3   Potassium Citrate  15 MEQ (1620 MG) TBCR Take 1 tablet by mouth 2 (two) times daily. 180 tablet 3   No facility-administered medications prior to visit.    Allergies  Allergen Reactions   Amoxicillin  Rash   Ciprofloxacin  Hcl Rash    Review of Systems As per HPI  PE:    04/13/2024    3:26 PM 01/14/2024    2:54 PM 08/02/2023    3:20 PM  Vitals with BMI  Height 6\' 6"  6\' 6"    Weight 260 lbs 6 oz 264 lbs 262 lbs 3 oz  BMI 30.1 30.51   Systolic 115 122 161  Diastolic 74 77 78  Pulse 89 72 78     Physical Exam  Gen: Alert, well appearing.  Patient is oriented to person, place, time, and situation. AFFECT: pleasant, lucid thought and speech. No further exam today  LABS:  Last CBC Lab Results  Component Value Date   WBC 5.5 08/02/2023   HGB 13.5 08/02/2023   HCT 40.4 08/02/2023   MCV 91.4 08/02/2023   MCH 30.6 04/17/2022   RDW 14.2 08/02/2023   PLT 256.0 08/02/2023   Last metabolic panel Lab Results  Component Value Date   GLUCOSE 84 01/14/2024   NA 138 01/14/2024   K 3.6 01/14/2024   CL 101 01/14/2024   CO2 29 01/14/2024   BUN 25 01/14/2024   CREATININE 1.11 01/14/2024   GFR 69.17 08/02/2023   CALCIUM  9.8 01/14/2024   PROT 6.9 01/14/2024   ALBUMIN 4.5 08/20/2022   BILITOT 1.1 01/14/2024   ALKPHOS 70 03/20/2021   AST 17 01/14/2024   ALT 18 01/14/2024   ANIONGAP 12 02/17/2020   Last lipids Lab Results  Component Value Date   CHOL 106 01/14/2024   HDL 45 01/14/2024   LDLCALC 47 01/14/2024   TRIG 67 01/14/2024   CHOLHDL 2.4 01/14/2024   Last hemoglobin A1c Lab Results  Component  Value Date   HGBA1C 6.5 (A) 04/13/2024   HGBA1C 6.5 04/13/2024   HGBA1C 6.5 (A) 04/13/2024   HGBA1C 6.5 04/13/2024   Last thyroid  functions Lab Results  Component Value Date   TSH 2.31 05/03/2023   Lab Results  Component Value Date   PSA 1.04 04/17/2022  PSA 1.08 02/18/2021   PSA 0.89 11/24/2019   Lab Results  Component Value Date   IRON 131 10/18/2020   TIBC 361 10/18/2020   FERRITIN 243 10/18/2020   IMPRESSION AND PLAN:  #1 diabetes without complication. POC Hba1c today is 6.5%, up from 5.9% several months ago. Continue Mounjaro  10 mg weekly (he is not ready to back off on the dosing strength yet despite feeling bad for a day or so after each injection). Discussed possible restart of Actos  if hemoglobin A1c continues to rise at next check in 3 months.  #2  weight management. Doing well on Mounjaro  but weight loss has plateaued. He feels flulike after each injection for about 1 day.  He wants to go ahead and continue at the 10 mg weekly dosing. He we will also continue to work on increasing exercise and improving diet.  #3 long-term PPI use. At next follow-up we will check iron and B12 levels. We regularly monitor his renal function as well. He will try decreasing his dose of omeprazole  to 20 mg over-the-counter strength to see if he can get by.  Encouraged him to wean off the medication in the future and only use as needed if possible.  An After Visit Summary was printed and given to the patient.  FOLLOW UP: Return in about 3 months (around 07/14/2024) for annual CPE (fasting). Next CPE after June 2025 Signed:  Arletha Lady, MD           04/13/2024

## 2024-04-13 NOTE — Patient Instructions (Signed)
   It was very nice to see you today!   Your A1c today was   PLEASE NOTE:  If labs were collected or images ordered, we will inform you of  results once we have received them and reviewed. We will contact you either by echart message, or telephone call.     If we ordered any referrals today, please let us  know if you have not heard from their office within the next 2 weeks. You should receive a letter via MyChart confirming if the referral was approved and their office contact information to schedule.

## 2024-07-14 ENCOUNTER — Encounter: Admitting: Family Medicine

## 2024-07-26 ENCOUNTER — Ambulatory Visit (INDEPENDENT_AMBULATORY_CARE_PROVIDER_SITE_OTHER): Admitting: Family Medicine

## 2024-07-26 VITALS — BP 124/76 | HR 80 | Temp 97.6°F | Ht 77.5 in | Wt 258.8 lb

## 2024-07-26 DIAGNOSIS — Z125 Encounter for screening for malignant neoplasm of prostate: Secondary | ICD-10-CM | POA: Diagnosis not present

## 2024-07-26 DIAGNOSIS — N1831 Chronic kidney disease, stage 3a: Secondary | ICD-10-CM

## 2024-07-26 DIAGNOSIS — E119 Type 2 diabetes mellitus without complications: Secondary | ICD-10-CM

## 2024-07-26 DIAGNOSIS — Z7985 Long-term (current) use of injectable non-insulin antidiabetic drugs: Secondary | ICD-10-CM | POA: Diagnosis not present

## 2024-07-26 DIAGNOSIS — E7800 Pure hypercholesterolemia, unspecified: Secondary | ICD-10-CM | POA: Diagnosis not present

## 2024-07-26 DIAGNOSIS — Z23 Encounter for immunization: Secondary | ICD-10-CM | POA: Diagnosis not present

## 2024-07-26 DIAGNOSIS — Z0000 Encounter for general adult medical examination without abnormal findings: Secondary | ICD-10-CM

## 2024-07-26 LAB — POCT GLYCOSYLATED HEMOGLOBIN (HGB A1C)
HbA1c POC (<> result, manual entry): 6.5 % (ref 4.0–5.6)
HbA1c, POC (controlled diabetic range): 6.5 % (ref 0.0–7.0)
HbA1c, POC (prediabetic range): 6.5 % — AB (ref 5.7–6.4)
Hemoglobin A1C: 6.5 % — AB (ref 4.0–5.6)

## 2024-07-26 NOTE — Progress Notes (Signed)
 Office Note 07/26/2024  CC:  Chief Complaint  Patient presents with   Annual Exam    Pt is fasting   Patient is a 58 y.o. male who is here for annual health maintenance exam and 41-month follow-up diabetes, chronic renal insufficiency, and hyperlipidemia.  INTERIM HX: Adrian Cook is doing well other than some recurrent low back pain. He takes Tylenol  sometimes.  He does not take NSAIDs.    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   Chronic renal insufficiency, stage 2 (mild)    borderline II/III: nephrol eval 10/2020->suspected cause is combo of recurrent kidney stones, polycystic kidney dz, HTN, and DM.   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  No mass effect or enhancing lesions as of 2019 MRI.  Renal u/s kidneys about 14 cm bilat, multiple benign cysts, 2 kidney stones on L. Neph plans f/u CT 2025 to monitor prog of kidney size.   Constipation    Diabetes mellitus without complication (HCC) 10/2017   Dx'd at [facility] C[facility]   ED (erectile dysfunction)    Sildenafil trial by urol 05/2018.   Gallstones    asymptomatic   GERD (gastroesophageal reflux disease)    Dr. Burnette   Heart murmur    Found at age 29, w/u unrevealing.  No cardiac symptoms.   Hypercholesterolemia    Kidney stones    [facility]: takes indapamide  1.25mg  qd + K cit bid.   Metabolic stone dz: renal leak hypercalcuria.   Obesity, Class I, BMI 30-34.9    OSA (obstructive sleep apnea) approx 2008   was put on CPAP but says he fell out of the habit of using it due to laziness.  Has CPAP machine but doesn't use it.   Osteoarthritis, multiple sites    Hands and right foot and both  knees.  Ortho started meloxicam  08/2018.   Primary osteoarthritis of both knees    Vsco supplementation helpful in ths past.   Prostate cancer screening    Urol did DRE (wnl) and PSA 05/24/18. PSA 0.89 Nov 24, 2019 at [facility]. 02/18/21 PSA 1.1 at urol.   Simple hepatic cyst    many, tiny    Past Surgical History:  Procedure Laterality Date   ANTERIOR CRUCIATE LIGAMENT REPAIR  1991   ACL and PCL left knee   COLONOSCOPY  02/01/2018   Tubular adenoma, repeat 5 years    ETT  03/2015   NORMAL   US  RENAL/AORTA     14cm bilaterally with multiple cysts mostly benign and 2 kideny stones on the left side. Repeat CT in 2025    Family History  Problem Relation Age of Onset   Osteoarthritis Mother    Arthritis Mother    Heart failure Father    Leukemia Maternal Grandfather    Colon cancer Neg Hx    Prostate cancer Neg Hx     Social History   Socioeconomic History   Marital status: Married    Spouse name: Not on file   Number of children: Not on file   Years of education: Not on file   Highest education level: 12th grade  Occupational History   Not on file  Tobacco Use   Smoking status: Never   Smokeless tobacco: Never  Vaping Use   Vaping status: Never Used  Substance and Sexual Activity   Alcohol use: No    Alcohol/week: 0.0 standard drinks of alcohol   Drug use: No   Sexual activity: Not on file  Other Topics Concern   Not on file  Social History Narrative   Married, no children.   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   No T/A/Ds.   Social Drivers of Corporate investment banker Strain: Low Risk  (07/26/2024)   Overall Financial Resource Strain (CARDIA)    Difficulty of Paying Living Expenses: Not hard at all  Food Insecurity: No Food Insecurity (07/26/2024)   Hunger Vital Sign    Worried About Running Out of Food in the Last Year: Never true    Ran Out of Food in the Last Year: Never true  Transportation Needs: No Transportation Needs  (07/26/2024)   PRAPARE - Administrator, Civil Service (Medical): No    Lack of Transportation (Non-Medical): No  Physical Activity: Inactive (07/26/2024)   Exercise Vital Sign    Days of Exercise per Week: 0 days    Minutes of Exercise per Session: Not on file  Stress: No Stress Concern Present (07/26/2024)   [employer] of Occupational Health - Occupational Stress Questionnaire    Feeling of Stress: Not at all  Social Connections: Socially Integrated (07/26/2024)   Social Connection and Isolation Panel    Frequency of Communication with Friends and Family: Three times a week    Frequency of Social Gatherings with Friends and Family: Twice a week    Attends Religious Services: More than 4 times per year    Active Member of [employer] or Organizations: Yes    Attends Banker Meetings: 1 to 4 times per year    Marital Status: Married  Catering manager Violence: Unknown (02/17/2022)   Received from [facility]   HITS    Physically Hurt: Not on file    Insult or Talk Down To: Not on file    Threaten Physical Harm: Not on file    Scream or Curse: Not on file    Outpatient Medications Prior to Visit  Medication Sig Dispense Refill   ACCU-CHEK GUIDE TEST test strip USED TO CHECK BLOOD SUGAR 3 TIMES A WEEK     Accu-Chek Softclix Lancets lancets USE TO CHECK SUGAR 3 TIMES WEEKLY.     atorvastatin  (LIPITOR) 20 MG tablet Take 1 tablet (20 mg total) by mouth daily. 90 tablet 3   blood glucose meter kit and supplies KIT Use to check glucose three (3) times weekly. 1 each 0   indapamide  (LOZOL ) 1.25 MG tablet Take 1.25 mg by mouth daily.     omeprazole  (PRILOSEC) 40 MG capsule Take 1 capsule (40 mg total) by mouth daily. 90 capsule 3   Potassium Citrate  15 MEQ (1620 MG) TBCR Take 1 tablet by mouth 2 (two) times daily. 180 tablet 3   tirzepatide  (MOUNJARO ) 10 MG/0.5ML Pen Inject 10 mg into the skin once a week. 6 mL 1   No facility-administered medications prior to  visit.    Allergies  Allergen Reactions   Amoxicillin  Rash   Ciprofloxacin  Hcl Rash    Review of Systems  Constitutional:  Negative for appetite change, chills, fatigue and fever.  HENT:  Negative for congestion, dental problem, ear pain and sore throat.  Eyes:  Negative for discharge, redness and visual disturbance.  Respiratory:  Negative for cough, chest tightness, shortness of breath and wheezing.   Cardiovascular:  Negative for chest pain, palpitations and leg swelling.  Gastrointestinal:  Negative for abdominal pain, blood in stool, diarrhea, nausea and vomiting.  Genitourinary:  Negative for difficulty urinating, dysuria, flank pain, frequency, hematuria and urgency.  Musculoskeletal:  Negative for arthralgias, back pain, joint swelling, myalgias and neck stiffness.  Skin:  Negative for pallor and rash.  Neurological:  Negative for dizziness, speech difficulty, weakness and headaches.  Hematological:  Negative for adenopathy. Does not bruise/bleed easily.  Psychiatric/Behavioral:  Negative for confusion and sleep disturbance. The patient is not nervous/anxious.     PE;    07/26/2024    3:43 PM 04/13/2024    3:26 PM 01/14/2024    2:54 PM  Vitals with BMI  Height 6' 5.5 6' 6 6' 6  Weight 258 lbs 13 oz 260 lbs 6 oz 264 lbs  BMI 30.28 30.1 30.51  Systolic 124 115 877  Diastolic 76 74 77  Pulse 80 89 72   Gen: Alert, well appearing.  Patient is oriented to person, place, time, and situation. AFFECT: pleasant, lucid thought and speech. ENT: Ears: EACs clear, normal epithelium.  TMs with good light reflex and landmarks bilaterally.  Eyes: no injection, icteris, swelling, or exudate.  EOMI, PERRLA. Nose: no drainage or turbinate edema/swelling.  No injection or focal lesion.  Mouth: lips without lesion/swelling.  Oral mucosa pink and moist.  Dentition intact and without obvious caries or gingival swelling.  Oropharynx without erythema, exudate, or swelling.  Neck:  supple/nontender.  No LAD, mass, or TM.  Carotid pulses 2+ bilaterally, without bruits. CV: RRR, no m/r/g.   LUNGS: CTA bilat, nonlabored resps, good aeration in all lung fields. ABD: soft, NT, ND, BS normal.  No hepatospenomegaly or mass.  No bruits. EXT: no clubbing, cyanosis, or edema.  Musculoskeletal: no joint swelling, erythema, warmth, or tenderness.  ROM of all joints intact. Skin - no sores or suspicious lesions or rashes or color changes  Pertinent labs:  Lab Results  Component Value Date   TSH 2.31 05/03/2023   Lab Results  Component Value Date   WBC 5.5 08/02/2023   HGB 13.5 08/02/2023   HCT 40.4 08/02/2023   MCV 91.4 08/02/2023   PLT 256.0 08/02/2023   Lab Results  Component Value Date   CREATININE 1.11 01/14/2024   BUN 25 01/14/2024   NA 138 01/14/2024   K 3.6 01/14/2024   CL 101 01/14/2024   CO2 29 01/14/2024   Lab Results  Component Value Date   ALT 18 01/14/2024   AST 17 01/14/2024   ALKPHOS 70 03/20/2021   BILITOT 1.1 01/14/2024   Lab Results  Component Value Date   CHOL 106 01/14/2024   Lab Results  Component Value Date   HDL 45 01/14/2024   Lab Results  Component Value Date   LDLCALC 47 01/14/2024   Lab Results  Component Value Date   TRIG 67 01/14/2024   Lab Results  Component Value Date   CHOLHDL 2.4 01/14/2024   Lab Results  Component Value Date   PSA 1.04 04/17/2022   PSA 1.08 02/18/2021   PSA 0.89 11/24/2019   Lab Results  Component Value Date   HGBA1C 6.5 (A) 07/26/2024   HGBA1C 6.5 07/26/2024   HGBA1C 6.5 (A) 07/26/2024   HGBA1C 6.5 07/26/2024   ASSESSMENT AND PLAN:   #1 health maintenance  exam: Reviewed age and gender appropriate health maintenance issues (prudent diet, regular exercise, health risks of tobacco and excessive alcohol, use of seatbelts, fire alarms in home, use of sunscreen).  Also reviewed age and gender appropriate health screening as well as vaccine recommendations. Vaccines: Flu--> given today.  otherwise up to date Labs: HP labs + Hba1c Prostate ca screening: PSA ordered Colon ca screening: recall as of 01/2023, patient is aware.  #2 diabetes without complication.  Good control with Mounjaro  10 mg weekly.. Point-of-care hemoglobin A1c today is 6.5% Urine microalbumin/creatinine today. Feet exam normal.  #3 hypercholesterolemia, doing well on 20 mg Lipitor daily. Fasting lipid panel and hepatic panel today.  #4 chronic renal insufficiency stage II/III. Avoid nsaids. Metabolic panel today.  An After Visit Summary was printed and given to the patient.  FOLLOW UP:  Return in about 3 months (around 10/25/2024) for routine chronic illness f/u.  Signed:  Gerlene Hockey, MD           07/26/2024

## 2024-07-26 NOTE — Patient Instructions (Signed)

## 2024-07-27 LAB — COMPREHENSIVE METABOLIC PANEL WITH GFR
ALT: 26 U/L (ref 0–53)
AST: 20 U/L (ref 0–37)
Albumin: 4.6 g/dL (ref 3.5–5.2)
Alkaline Phosphatase: 77 U/L (ref 39–117)
BUN: 19 mg/dL (ref 6–23)
CO2: 28 meq/L (ref 19–32)
Calcium: 9.6 mg/dL (ref 8.4–10.5)
Chloride: 101 meq/L (ref 96–112)
Creatinine, Ser: 1.2 mg/dL (ref 0.40–1.50)
GFR: 67.33 mL/min (ref >60.00)
Glucose, Bld: 96 mg/dL (ref 70–99)
Potassium: 3.9 meq/L (ref 3.5–5.1)
Sodium: 139 meq/L (ref 135–145)
Total Bilirubin: 1.2 mg/dL (ref 0.2–1.2)
Total Protein: 7.2 g/dL (ref 6.0–8.3)

## 2024-07-27 LAB — MICROALBUMIN / CREATININE URINE RATIO
Creatinine,U: 44.6 mg/dL
Microalb Creat Ratio: UNDETERMINED mg/g (ref 0.0–30.0)
Microalb, Ur: <0.7 mg/dL

## 2024-07-27 LAB — LIPID PANEL
Cholesterol: 94 mg/dL (ref 0–200)
HDL: 42.7 mg/dL (ref >39.00)
LDL Cholesterol: 37 mg/dL (ref 0–99)
NonHDL: 50.94
Total CHOL/HDL Ratio: 2
Triglycerides: 69 mg/dL (ref 0.0–149.0)
VLDL: 13.8 mg/dL (ref 0.0–40.0)

## 2024-07-27 LAB — CBC WITH DIFFERENTIAL/PLATELET
Basophils Absolute: 0.1 K/uL (ref 0.0–0.1)
Basophils Relative: 0.9 % (ref 0.0–3.0)
Eosinophils Absolute: 0.2 K/uL (ref 0.0–0.7)
Eosinophils Relative: 3.5 % (ref 0.0–5.0)
HCT: 41.9 % (ref 39.0–52.0)
Hemoglobin: 14.5 g/dL (ref 13.0–17.0)
Lymphocytes Relative: 23.8 % (ref 12.0–46.0)
Lymphs Abs: 1.6 K/uL (ref 0.7–4.0)
MCHC: 34.5 g/dL (ref 30.0–36.0)
MCV: 88 fl (ref 78.0–100.0)
Monocytes Absolute: 0.4 K/uL (ref 0.1–1.0)
Monocytes Relative: 6.2 % (ref 3.0–12.0)
Neutro Abs: 4.3 K/uL (ref 1.4–7.7)
Neutrophils Relative %: 65.6 % (ref 43.0–77.0)
Platelets: 246 K/uL (ref 150.0–400.0)
RBC: 4.76 Mil/uL (ref 4.22–5.81)
RDW: 13.6 % (ref 11.5–15.5)
WBC: 6.5 K/uL (ref 4.0–10.5)

## 2024-07-27 LAB — TSH: TSH: 2.48 u[IU]/mL (ref 0.35–5.50)

## 2024-07-27 LAB — PSA: PSA: 1.26 ng/mL (ref 0.10–4.00)

## 2024-07-28 ENCOUNTER — Ambulatory Visit: Payer: Self-pay | Admitting: Family Medicine

## 2024-09-06 ENCOUNTER — Other Ambulatory Visit: Payer: Self-pay | Admitting: Internal Medicine

## 2024-09-06 DIAGNOSIS — N182 Chronic kidney disease, stage 2 (mild): Secondary | ICD-10-CM

## 2024-09-07 ENCOUNTER — Ambulatory Visit
Admission: RE | Admit: 2024-09-07 | Discharge: 2024-09-07 | Disposition: A | Discharge status: Home / Self Care | Source: Ambulatory Visit | Attending: Internal Medicine | Admitting: Internal Medicine

## 2024-09-07 DIAGNOSIS — N182 Chronic kidney disease, stage 2 (mild): Secondary | ICD-10-CM

## 2024-09-07 DIAGNOSIS — N281 Cyst of kidney, acquired: Secondary | ICD-10-CM | POA: Diagnosis not present

## 2024-09-07 DIAGNOSIS — N1832 Chronic kidney disease, stage 3b: Secondary | ICD-10-CM | POA: Diagnosis not present

## 2024-10-04 DIAGNOSIS — N182 Chronic kidney disease, stage 2 (mild): Secondary | ICD-10-CM | POA: Diagnosis not present

## 2024-10-05 DIAGNOSIS — E1122 Type 2 diabetes mellitus with diabetic chronic kidney disease: Secondary | ICD-10-CM | POA: Diagnosis not present

## 2024-10-17 DIAGNOSIS — N5201 Erectile dysfunction due to arterial insufficiency: Secondary | ICD-10-CM | POA: Diagnosis not present

## 2024-10-17 DIAGNOSIS — R82994 Hypercalciuria: Secondary | ICD-10-CM | POA: Diagnosis not present

## 2024-10-17 DIAGNOSIS — N200 Calculus of kidney: Secondary | ICD-10-CM | POA: Diagnosis not present

## 2024-10-23 DIAGNOSIS — G8929 Other chronic pain: Secondary | ICD-10-CM | POA: Diagnosis not present

## 2024-10-23 DIAGNOSIS — M545 Low back pain, unspecified: Secondary | ICD-10-CM | POA: Diagnosis not present

## 2024-10-23 DIAGNOSIS — M170 Bilateral primary osteoarthritis of knee: Secondary | ICD-10-CM | POA: Diagnosis not present

## 2024-11-01 DIAGNOSIS — M170 Bilateral primary osteoarthritis of knee: Secondary | ICD-10-CM | POA: Diagnosis not present

## 2024-11-07 DIAGNOSIS — M25561 Pain in right knee: Secondary | ICD-10-CM | POA: Diagnosis not present

## 2024-11-07 DIAGNOSIS — M5451 Vertebrogenic low back pain: Secondary | ICD-10-CM | POA: Diagnosis not present

## 2024-11-07 DIAGNOSIS — M25562 Pain in left knee: Secondary | ICD-10-CM | POA: Diagnosis not present

## 2024-11-14 DIAGNOSIS — M5451 Vertebrogenic low back pain: Secondary | ICD-10-CM | POA: Diagnosis not present

## 2024-11-14 DIAGNOSIS — M25562 Pain in left knee: Secondary | ICD-10-CM | POA: Diagnosis not present

## 2024-11-14 DIAGNOSIS — M25561 Pain in right knee: Secondary | ICD-10-CM | POA: Diagnosis not present
